# Patient Record
Sex: Female | Born: 1935 | Race: White | Hispanic: No | Smoking: Never smoker
Health system: Southern US, Community
[De-identification: ages and names within clinical notes are randomized; demographics above are authoritative.]

## PROBLEM LIST (undated history)

## (undated) DIAGNOSIS — J189 Pneumonia, unspecified organism: Secondary | ICD-10-CM

## (undated) DIAGNOSIS — F419 Anxiety disorder, unspecified: Secondary | ICD-10-CM

## (undated) DIAGNOSIS — D649 Anemia, unspecified: Secondary | ICD-10-CM

## (undated) DIAGNOSIS — I809 Phlebitis and thrombophlebitis of unspecified site: Secondary | ICD-10-CM

## (undated) DIAGNOSIS — D6851 Activated protein C resistance: Secondary | ICD-10-CM

## (undated) DIAGNOSIS — M5136 Other intervertebral disc degeneration, lumbar region: Secondary | ICD-10-CM

## (undated) DIAGNOSIS — M51369 Other intervertebral disc degeneration, lumbar region without mention of lumbar back pain or lower extremity pain: Secondary | ICD-10-CM

## (undated) DIAGNOSIS — E785 Hyperlipidemia, unspecified: Secondary | ICD-10-CM

## (undated) DIAGNOSIS — M5126 Other intervertebral disc displacement, lumbar region: Secondary | ICD-10-CM

## (undated) DIAGNOSIS — M1990 Unspecified osteoarthritis, unspecified site: Secondary | ICD-10-CM

## (undated) DIAGNOSIS — K4090 Unilateral inguinal hernia, without obstruction or gangrene, not specified as recurrent: Secondary | ICD-10-CM

## (undated) HISTORY — PX: EYE SURGERY: SHX253

## (undated) HISTORY — PX: BREAST CYST ASPIRATION: SHX578

## (undated) HISTORY — PX: HERNIA REPAIR: SHX51

---
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
## 1997-10-14 DIAGNOSIS — J189 Pneumonia, unspecified organism: Secondary | ICD-10-CM

## 1997-10-14 HISTORY — DX: Pneumonia, unspecified organism: J18.9

## 2005-06-24 ENCOUNTER — Ambulatory Visit: Payer: Self-pay | Admitting: Unknown Physician Specialty

## 2006-06-12 ENCOUNTER — Ambulatory Visit: Payer: Self-pay | Admitting: Gastroenterology

## 2006-07-01 ENCOUNTER — Ambulatory Visit: Payer: Self-pay | Admitting: Unknown Physician Specialty

## 2007-08-11 ENCOUNTER — Ambulatory Visit: Payer: Self-pay | Admitting: Unknown Physician Specialty

## 2008-08-23 ENCOUNTER — Ambulatory Visit: Payer: Self-pay | Admitting: Unknown Physician Specialty

## 2009-08-25 ENCOUNTER — Ambulatory Visit: Payer: Self-pay | Admitting: Unknown Physician Specialty

## 2009-08-31 ENCOUNTER — Ambulatory Visit: Payer: Self-pay | Admitting: Unknown Physician Specialty

## 2010-03-01 ENCOUNTER — Ambulatory Visit: Payer: Self-pay | Admitting: Unknown Physician Specialty

## 2010-09-28 ENCOUNTER — Ambulatory Visit: Payer: Self-pay | Admitting: Unknown Physician Specialty

## 2011-06-12 ENCOUNTER — Ambulatory Visit: Payer: Self-pay | Admitting: Family Medicine

## 2011-09-09 ENCOUNTER — Ambulatory Visit: Payer: Self-pay | Admitting: Gastroenterology

## 2011-09-11 LAB — PATHOLOGY REPORT

## 2011-09-17 ENCOUNTER — Emergency Department: Payer: Self-pay | Admitting: *Deleted

## 2011-10-02 ENCOUNTER — Ambulatory Visit: Payer: Self-pay | Admitting: Unknown Physician Specialty

## 2011-10-15 HISTORY — PX: COLONOSCOPY: SHX174

## 2012-10-02 ENCOUNTER — Ambulatory Visit: Payer: Self-pay | Admitting: Family Medicine

## 2012-10-14 HISTORY — PX: CATARACT EXTRACTION: SUR2

## 2013-10-04 ENCOUNTER — Ambulatory Visit: Payer: Self-pay | Admitting: Family Medicine

## 2014-01-19 DIAGNOSIS — E785 Hyperlipidemia, unspecified: Secondary | ICD-10-CM | POA: Insufficient documentation

## 2014-01-19 DIAGNOSIS — Z8672 Personal history of thrombophlebitis: Secondary | ICD-10-CM | POA: Insufficient documentation

## 2014-01-19 DIAGNOSIS — D649 Anemia, unspecified: Secondary | ICD-10-CM | POA: Insufficient documentation

## 2014-01-19 DIAGNOSIS — M810 Age-related osteoporosis without current pathological fracture: Secondary | ICD-10-CM | POA: Insufficient documentation

## 2014-10-05 ENCOUNTER — Ambulatory Visit: Payer: Self-pay | Admitting: Family Medicine

## 2015-12-13 DIAGNOSIS — R05 Cough: Secondary | ICD-10-CM | POA: Diagnosis not present

## 2015-12-13 DIAGNOSIS — J029 Acute pharyngitis, unspecified: Secondary | ICD-10-CM | POA: Diagnosis not present

## 2015-12-13 DIAGNOSIS — R5381 Other malaise: Secondary | ICD-10-CM | POA: Diagnosis not present

## 2015-12-13 DIAGNOSIS — R5383 Other fatigue: Secondary | ICD-10-CM | POA: Diagnosis not present

## 2016-01-10 DIAGNOSIS — Z0000 Encounter for general adult medical examination without abnormal findings: Secondary | ICD-10-CM | POA: Diagnosis not present

## 2016-01-23 DIAGNOSIS — Z0000 Encounter for general adult medical examination without abnormal findings: Secondary | ICD-10-CM | POA: Diagnosis not present

## 2016-01-23 DIAGNOSIS — E785 Hyperlipidemia, unspecified: Secondary | ICD-10-CM | POA: Diagnosis not present

## 2016-02-28 DIAGNOSIS — Z961 Presence of intraocular lens: Secondary | ICD-10-CM | POA: Diagnosis not present

## 2016-04-17 DIAGNOSIS — E785 Hyperlipidemia, unspecified: Secondary | ICD-10-CM | POA: Diagnosis not present

## 2016-04-23 DIAGNOSIS — E785 Hyperlipidemia, unspecified: Secondary | ICD-10-CM | POA: Diagnosis not present

## 2016-08-20 DIAGNOSIS — H903 Sensorineural hearing loss, bilateral: Secondary | ICD-10-CM | POA: Diagnosis not present

## 2016-09-19 DIAGNOSIS — R1909 Other intra-abdominal and pelvic swelling, mass and lump: Secondary | ICD-10-CM | POA: Diagnosis not present

## 2016-09-19 DIAGNOSIS — I10 Essential (primary) hypertension: Secondary | ICD-10-CM | POA: Diagnosis not present

## 2016-09-19 DIAGNOSIS — Z79899 Other long term (current) drug therapy: Secondary | ICD-10-CM | POA: Diagnosis not present

## 2016-09-19 DIAGNOSIS — K4090 Unilateral inguinal hernia, without obstruction or gangrene, not specified as recurrent: Secondary | ICD-10-CM | POA: Diagnosis not present

## 2016-09-19 DIAGNOSIS — R1031 Right lower quadrant pain: Secondary | ICD-10-CM | POA: Diagnosis not present

## 2016-09-19 DIAGNOSIS — E785 Hyperlipidemia, unspecified: Secondary | ICD-10-CM | POA: Diagnosis not present

## 2016-10-02 ENCOUNTER — Encounter: Payer: Self-pay | Admitting: *Deleted

## 2016-10-02 DIAGNOSIS — K4090 Unilateral inguinal hernia, without obstruction or gangrene, not specified as recurrent: Secondary | ICD-10-CM | POA: Diagnosis not present

## 2016-10-17 DIAGNOSIS — E785 Hyperlipidemia, unspecified: Secondary | ICD-10-CM | POA: Diagnosis not present

## 2016-10-21 ENCOUNTER — Ambulatory Visit: Payer: Self-pay | Admitting: General Surgery

## 2016-10-31 ENCOUNTER — Other Ambulatory Visit
Admission: RE | Admit: 2016-10-31 | Discharge: 2016-10-31 | Disposition: A | Discharge status: Home / Self Care | Payer: PPO | Source: Ambulatory Visit | Attending: General Surgery | Admitting: General Surgery

## 2016-10-31 ENCOUNTER — Ambulatory Visit (INDEPENDENT_AMBULATORY_CARE_PROVIDER_SITE_OTHER): Payer: PPO | Admitting: General Surgery

## 2016-10-31 ENCOUNTER — Other Ambulatory Visit: Payer: Self-pay | Admitting: *Deleted

## 2016-10-31 ENCOUNTER — Encounter: Payer: Self-pay | Admitting: *Deleted

## 2016-10-31 ENCOUNTER — Ambulatory Visit: Payer: Self-pay | Admitting: General Surgery

## 2016-10-31 ENCOUNTER — Encounter: Payer: Self-pay | Admitting: General Surgery

## 2016-10-31 VITALS — BP 112/80 | HR 76 | Resp 14 | Ht 62.0 in | Wt 142.0 lb

## 2016-10-31 DIAGNOSIS — Z8249 Family history of ischemic heart disease and other diseases of the circulatory system: Secondary | ICD-10-CM

## 2016-10-31 DIAGNOSIS — D6851 Activated protein C resistance: Secondary | ICD-10-CM

## 2016-10-31 DIAGNOSIS — K4090 Unilateral inguinal hernia, without obstruction or gangrene, not specified as recurrent: Secondary | ICD-10-CM

## 2016-10-31 HISTORY — DX: Activated protein C resistance: D68.51

## 2016-10-31 LAB — CBC WITH DIFFERENTIAL/PLATELET
BASOS ABS: 0.1 10*3/uL (ref 0–0.1)
Basophils Relative: 1 %
Eosinophils Absolute: 0.2 10*3/uL (ref 0–0.7)
Eosinophils Relative: 2 %
HEMATOCRIT: 39.5 % (ref 35.0–47.0)
HEMOGLOBIN: 13.3 g/dL (ref 12.0–16.0)
Lymphocytes Relative: 30 %
Lymphs Abs: 2.5 10*3/uL (ref 1.0–3.6)
MCH: 30.5 pg (ref 26.0–34.0)
MCHC: 33.8 g/dL (ref 32.0–36.0)
MCV: 90.2 fL (ref 80.0–100.0)
Monocytes Absolute: 0.7 10*3/uL (ref 0.2–0.9)
Monocytes Relative: 9 %
NEUTROS PCT: 58 %
Neutro Abs: 4.7 10*3/uL (ref 1.4–6.5)
Platelets: 198 10*3/uL (ref 150–440)
RBC: 4.38 MIL/uL (ref 3.80–5.20)
RDW: 13.6 % (ref 11.5–14.5)
WBC: 8.1 10*3/uL (ref 3.6–11.0)

## 2016-10-31 NOTE — Patient Instructions (Signed)
Inguinal Hernia, Adult Introduction An inguinal hernia is when fat or the intestines push through the area where the leg meets the lower abdomen (groin) and create a rounded lump (bulge). This condition develops over time. There are three types of inguinal hernias. These types include:  Hernias that can be pushed back into the belly (are reducible).  Hernias that are not reducible (are incarcerated).  Hernias that are not reducible and lose their blood supply (are strangulated). This type of hernia requires emergency surgery. What are the causes? This condition is caused by having a weak spot in the muscles or tissue. This weakness lets the hernia poke through. This condition can be triggered by:  Suddenly straining the muscles of the lower abdomen.  Lifting heavy objects.  Straining to have a bowel movement. Difficult bowel movements (constipation) can lead to this.  Coughing. What increases the risk? This condition is more likely to develop in:  Men.  Pregnant women.  People who:  Are overweight.  Work in jobs that require long periods of standing or heavy lifting.  Have had an inguinal hernia before.  Smoke or have lung disease. These factors can lead to long-lasting (chronic) coughing. What are the signs or symptoms? Symptoms can depend on the size of the hernia. Often, a small inguinal hernia has no symptoms. Symptoms of a larger hernia include:  A lump in the groin. This is easier to see when the person is standing. It might not be visible when he or she is lying down.  Pain or burning in the groin. This occurs especially when lifting, straining, or coughing.  A dull ache or a feeling of pressure in the groin.  A lump in the scrotum in men. Symptoms of a strangulated inguinal hernia can include:  A bulge in the groin that is very painful and tender to the touch.  A bulge that turns red or purple.  Fever, nausea, and vomiting.  The inability to have a bowel  movement or to pass gas. How is this diagnosed? This condition is diagnosed with a medical history and physical exam. Your health care provider may feel your groin area and ask you to cough. How is this treated? Treatment for this condition varies depending on the size of your hernia and whether you have symptoms. If you do not have symptoms, your health care provider may have you watch your hernia carefully and come in for follow-up visits. If your hernia is larger or if you have symptoms, your treatment will include surgery. Follow these instructions at home: Lifestyle  Drink enough fluid to keep your urine clear or pale yellow.  Eat a diet that includes a lot of fiber. Eat plenty of fruits, vegetables, and whole grains. Talk with your health care provider if you have questions.  Avoid lifting heavy objects.  Avoid standing for long periods of time.  Do not use tobacco products, including cigarettes, chewing tobacco, or e-cigarettes. If you need help quitting, ask your health care provider.  Maintain a healthy weight. General instructions  Do not try to force the hernia back in.  Watch your hernia for any changes in color or size. Let your health care provider know if any changes occur.  Take over-the-counter and prescription medicines only as told by your health care provider.  Keep all follow-up visits as told by your health care provider. This is important. Contact a health care provider if:  You have a fever.  You have new symptoms.  Your symptoms  get worse. Get help right away if:  You have pain in the groin that suddenly gets worse.  A bulge in the groin gets bigger suddenly and does not go down.  You are a man and you have a sudden pain in the scrotum, or the size of your scrotum suddenly changes.  A bulge in the groin area becomes red or purple and is painful to the touch.  You have nausea or vomiting that does not go away.  You feel your heart beating a lot  more quickly than normal.  You cannot have a bowel movement or pass gas. This information is not intended to replace advice given to you by your health care provider. Make sure you discuss any questions you have with your health care provider. Document Released: 02/16/2009 Document Revised: 03/07/2016 Document Reviewed: 08/10/2014  2017 Elsevier

## 2016-10-31 NOTE — Addendum Note (Signed)
Addended by: Robert Bellow on: 10/31/2016 03:07 PM   Modules accepted: Orders, SmartSet

## 2016-10-31 NOTE — Addendum Note (Signed)
Addended by: Dominga Ferry on: 10/31/2016 02:48 PM   Modules accepted: Level of Service

## 2016-10-31 NOTE — Progress Notes (Addendum)
Patient ID: Kendra Campbell, female   DOB: [birth year], 82 y.o.   MRN: [id number]  Chief Complaint  Patient presents with  . Hernia    HPI Kendra Campbell is a 82 y.o. female here today for a evaluation of a right inguinal hernia. She noticed this area about 10 years ago. No change in size She is fairly vague about how the hernia has bothered her. 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  In December, 2017 she went to the hospital for a sharp pain in her right inguinal area. This resolved with manual reduction of the hernia completed by the ED physician after CT results were available. She's been asymptomatic since that time.  She is due for a colonoscopy in March, 2018To follow-up tubular adenomas identified at the time of her 2012 exam..  HPI  No past medical history on file.  Past Surgical History:  Procedure Laterality Date  . APPENDECTOMY  1957  . CATARACT EXTRACTION  2014  . COLONOSCOPY  2013    Family History  Problem Relation Age of Onset  . Osteoporosis Mother   . Stomach cancer Father   . Factor V Leiden deficiency Neg Hx     PE, long term anticoagulation.    Social History Social History  Substance Use Topics  . Smoking status: Never Smoker  . Smokeless tobacco: Never Used  . Alcohol use No    Allergies  Allergen Reactions  . Alendronate Nausea And Vomiting  . Ampicillin Nausea And Vomiting  . Codeine Sulfate Nausea And Vomiting  . Penicillins Nausea And Vomiting    Current Outpatient Prescriptions  Medication Sig Dispense Refill  . atorvastatin (LIPITOR) 40 MG tablet Take 40 mg by mouth daily at 6 PM.     . sertraline (ZOLOFT) 25 MG tablet Take 25 mg by mouth daily.      No current facility-administered medications for this visit.     Review of Systems Review of Systems  Constitutional: Negative.   Respiratory: Negative.   Cardiovascular: Negative.     Blood pressure 112/80, pulse  76, resp. rate 14, height 5\' 2"  (1.575 m), weight 142 lb (64.4 kg).  Physical Exam Physical Exam  Constitutional: She is oriented to person, place, and time. She appears well-developed and well-nourished.  Eyes: Conjunctivae are normal. No scleral icterus.  Neck: Neck supple.  Cardiovascular: Normal rate, regular rhythm and normal heart sounds.   Pulmonary/Chest: Effort normal and breath sounds normal.  Abdominal: Soft. Normal appearance and bowel sounds are normal. There is no hepatomegaly. There is no tenderness. A hernia is present. Hernia confirmed positive in the right inguinal area.    Lymphadenopathy:    She has no cervical adenopathy.  Neurological: She is alert and oriented to person, place, and time.  Skin: Skin is warm and dry.    Data Reviewed CT scan images from 09/19/2016 were reviewed. A sizable defect in the area of the internal ring is identified with a large amount of small bowel contents. No evidence of obstruction. The cecum and terminal ileum were identified protruding through an inguinal defect. Sigmoid diverticulosis. No evidence of diverticulitis. 7 inflammatory bowel changes. No ascites.  Laboratory studies dated 10/17/2016 completed at her PCP office showed an estimated GFR of 60, creatinine 0.9, normal electrolytes, normal liver function studies in [place] 4.6. Random blood sugar 89. Normal cholesterol 172, triglyceride 93 and an HDL of 54.  CBC dated 01/10/2016  showed a hemoglobin of 13.0 with an MCV of 92, white blood cell count 6500 and a platelet count of based bar 209,000.   Assessment    Right inguinal hernia, episode of likely incarceration, resolved.  Family history Leiden factor V deficiency.   Plan    After repair would be appropriate considering the patient's need to seek emergency attention last month.  Her daughter, Kendra Campbell was conferenced in for the discussion of surgical intervention. The role of prosthetic mesh was discussed.  Anticipate that a home assistant would be appropriate for for 5 days and afterwards the patient would likely be able to drive within a week to 10 days to surgery.  The patient daughter reported that she and her sister have both been detected to have Leiden factor V deficiency. Kendra Campbell was picked up with an early heart attack as well as pulmonary embolus and is now actually with daily injectable anticoagulation. She reports that her father had a history of some hematologic problem, likely thrombocytopenia by her description.  We'll arrange for factor V Leiden level determination and a current CBC prior to surgical intervention.  The patient has never been tested for any clotting abnormalities as she has been incredibly healthy, her only surgical intervention being her appendectomy at the end of high school.    Hernia precautions and incarceration were discussed with the patient. If they develop symptoms of an incarcerated hernia, they were encouraged to seek prompt medical attention.  I have recommended repair of the hernia using mesh on an outpatient basis in the near future. The risk of infection was reviewed. The role of prosthetic mesh to minimize the risk of recurrence was reviewed.  This information has been scribed by Gaspar Cola CMA.   Robert Bellow 10/31/2016, 2:36 PM

## 2016-10-31 NOTE — Progress Notes (Signed)
Patient's surgery has been scheduled for 11-11-16 at [facility].

## 2016-11-04 LAB — FACTOR 5 LEIDEN

## 2016-11-05 ENCOUNTER — Inpatient Hospital Stay: Admission: RE | Admit: 2016-11-05 | Payer: PPO | Source: Ambulatory Visit

## 2016-11-05 MED ORDER — ARMC OPHTHALMIC DILATING DROPS
OPHTHALMIC | Status: AC
  Filled 2016-11-05: qty 0.4

## 2016-11-05 MED ORDER — MOXIFLOXACIN HCL 0.5 % OP SOLN
OPHTHALMIC | Status: AC
  Filled 2016-11-05: qty 3

## 2016-11-06 ENCOUNTER — Encounter: Payer: Self-pay | Admitting: General Surgery

## 2016-11-06 ENCOUNTER — Ambulatory Visit: Payer: Self-pay | Admitting: General Surgery

## 2016-11-06 DIAGNOSIS — Z01810 Encounter for preprocedural cardiovascular examination: Secondary | ICD-10-CM | POA: Diagnosis not present

## 2016-11-06 NOTE — Progress Notes (Signed)
I spoke with Dr. Mike Gip from hematology regarding the patient being heterozygous for factor V. The patient has had no clotting episodes in the past, and has no risk factors such as smoking.  She is at slightly increased risk for clotting with her upcoming surgery, although it is anticipated she'll be one to ambulate early.  No preoperative anticoagulation is required.  Consideration to using Lovenox 40 mg daily for 1 week after surgery is a consideration to lower her risk for postoperative DVT.

## 2016-11-06 NOTE — Patient Instructions (Signed)
  Your procedure is scheduled on: 11-11-16 [facility]) Report to Same Day Surgery 2nd floor medical mall [facility] Entrance-take elevator on left to 2nd floor.  Check in with surgery information desk.) To find out your arrival time please call [phone] between 1PM - 3PM on 11-08-16 (FRIDAY)  Remember: Instructions that are not followed completely may result in serious medical risk, up to and including death, or upon the discretion of your surgeon and anesthesiologist your surgery may need to be rescheduled.    _x___ 1. Do not eat food or drink liquids after midnight. No gum chewing or hard candies.     __x__ 2. No Alcohol for 24 hours before or after surgery.   __x__3. No Smoking for 24 prior to surgery.   ____  4. Bring all medications with you on the day of surgery if instructed.    __x__ 5. Notify your doctor if there is any change in your medical condition     (cold, fever, infections).     Do not wear jewelry, make-up, hairpins, clips or nail polish.  Do not wear lotions, powders, or perfumes. You may wear deodorant.  Do not shave 48 hours prior to surgery. Men may shave face and neck.  Do not bring valuables to the hospital.    [facility] is not responsible for any belongings or valuables.               Contacts, dentures or bridgework may not be worn into surgery.  Leave your suitcase in the car. After surgery it may be brought to your room.  For patients admitted to the hospital, discharge time is determined by your treatment team.   Patients discharged the day of surgery will not be allowed to drive home.  You will need someone to drive you home and stay with you the night of your procedure.    Please read over the following fact sheets that you were given:   [facility] Preparing for Surgery and or MRSA Information   _x___ Take these medicines the morning of surgery with A SIP OF WATER:    1. SERTRALINE (ZOLOFT)  2.  3.  4.  5.  6.  ____Fleets enema or  Magnesium Citrate as directed.   _x___ Use CHG Soap or sage wipes as directed on instruction sheet   ____ Use inhalers on the day of surgery and bring to hospital day of surgery  ____ Stop metformin 2 days prior to surgery    ____ Take 1/2 of usual insulin dose the night before surgery and none on the morning of surgery.   ____ Stop Aspirin, Coumadin, Pllavix ,Eliquis, Effient, or Pradaxa  __ Stop Anti-inflammatories such as Advil, Aleve, Ibuprofen, Motrin, Naproxen,          Naprosyn, Goodies powders or aspirin products. Ok to take Tylenol.   _X___ Stop supplements until after surgery-STOP CO-Q 10 AND VITAMIN C NOW   ____ Bring C-Pap to the hospital.

## 2016-11-07 ENCOUNTER — Encounter
Admission: RE | Admit: 2016-11-07 | Discharge: 2016-11-07 | Disposition: A | Discharge status: Home / Self Care | Payer: PPO | Source: Ambulatory Visit | Attending: General Surgery | Admitting: General Surgery

## 2016-11-07 DIAGNOSIS — Z01810 Encounter for preprocedural cardiovascular examination: Secondary | ICD-10-CM | POA: Insufficient documentation

## 2016-11-07 DIAGNOSIS — R9431 Abnormal electrocardiogram [ECG] [EKG]: Secondary | ICD-10-CM | POA: Diagnosis not present

## 2016-11-07 HISTORY — DX: Anxiety disorder, unspecified: F41.9

## 2016-11-07 HISTORY — DX: Pneumonia, unspecified organism: J18.9

## 2016-11-07 HISTORY — DX: Other intervertebral disc displacement, lumbar region: M51.26

## 2016-11-07 HISTORY — DX: Anemia, unspecified: D64.9

## 2016-11-07 HISTORY — DX: Other intervertebral disc degeneration, lumbar region: M51.36

## 2016-11-07 HISTORY — DX: Unspecified osteoarthritis, unspecified site: M19.90

## 2016-11-07 HISTORY — DX: Other intervertebral disc degeneration, lumbar region without mention of lumbar back pain or lower extremity pain: M51.369

## 2016-11-08 ENCOUNTER — Telehealth: Payer: Self-pay

## 2016-11-08 ENCOUNTER — Encounter: Payer: Self-pay | Admitting: *Deleted

## 2016-11-08 NOTE — Telephone Encounter (Signed)
Patient called to reschedule her surgery for 11/11/16. Her daughter who would be staying with her has the flu. She wants to reschedule this for about 3 weeks out. She is now rescheduled for surgery at [facility] on 12/02/16. She is aware of date and instructions.

## 2016-11-08 NOTE — Telephone Encounter (Signed)
The patient will have a short pre op visit in office with Dr Bary Castilla for this on 11/21/16 at 9:45 am.

## 2016-11-21 ENCOUNTER — Encounter: Payer: Self-pay | Admitting: General Surgery

## 2016-11-21 ENCOUNTER — Ambulatory Visit (INDEPENDENT_AMBULATORY_CARE_PROVIDER_SITE_OTHER): Payer: PPO | Admitting: General Surgery

## 2016-11-21 VITALS — BP 132/70 | HR 82 | Resp 18 | Ht 62.0 in | Wt 144.0 lb

## 2016-11-21 DIAGNOSIS — D6851 Activated protein C resistance: Secondary | ICD-10-CM | POA: Diagnosis not present

## 2016-11-21 DIAGNOSIS — K4090 Unilateral inguinal hernia, without obstruction or gangrene, not specified as recurrent: Secondary | ICD-10-CM

## 2016-11-21 NOTE — Patient Instructions (Addendum)
The patient is aware to call back for any questions or concerns.  How and Where to Give Subcutaneous Enoxaparin Injections Enoxaparin is an injectable medicine. It is used to help prevent blood clots from developing in your veins. Health care providers often use anticoagulants like enoxaparin to prevent clots following surgery. Enoxaparin is also used in combination with other medicines to treat blood clots and heart attacks. If blood clots are left untreated, they can be life threatening. Enoxaparin comes in single-use syringes. You inject enoxaparin through a syringe into your belly (abdomen). You should change the injection site each time you give yourself a shot. Continue the enoxaparin injections as directed by your health care provider. Your health care provider will use blood clotting test results to decide when you can safely stop using enoxaparin injections. If your health care provider prescribes any additional medicines, use the medicines exactly as directed. How do I inject enoxaparin? 1. Wash your hands with soap and water. 2. Clean the selected injection site as directed by your health care provider. 3. Remove the needle cap by pulling it straight off the syringe. 4. When using a prefilled syringe, do not push the air bubble out of the syringe before the injection. The air bubble will help you get all of the medicine out of the syringe. 5. Hold the syringe like a pencil using your writing hand. 6. Use your other hand to pinch and hold an inch of the cleansed skin. 7. Insert the entire needle straight down into the fold of skin. 8. Push the plunger with your thumb until the syringe is empty. 9. Pull the needle straight out of your skin. 10. Enoxaparin injection prefilled syringes and graduated prefilled syringes are available with a system that shields the needle after injection. After you have completed your injection and removed the needle from your skin, firmly push down on the  plunger. The protective sleeve will automatically cover the needle and you will hear a click. The click means the needle is safely covered. 11. Place the syringe in the nearest needle box, also called a sharps container. If you do not have a sharps container, you can use a hard-sided plastic container with a secure lid, such as an empty laundry detergent bottle. What else do I need to know?  Do not use enoxaparin if:  You have allergies to heparin or pork products.  You have been diagnosed with a condition called thrombocytopenia.  Do not use the syringe or needle more than one time.  Use medicines only as directed by your health care provider.  Changes in medicines, supplements, diet, and illness can affect your anticoagulation therapy. Be sure to inform your health care provider of any of these changes.  It is important that you tell all of your health care providers and your dentist that you are taking an anticoagulant, especially if you are injured or plan to have any type of procedure.  While on anticoagulants, you will need to have blood tests done routinely as directed by your health care provider.  While using this medicine, avoid physical activities or sports that could result in a fall or cause injury.  Follow up with your laboratory test and health care provider appointments as directed. It is very important to keep your appointments. Not keeping appointments could result in a chronic or permanent injury, pain, or disability.  Before giving your medicine, you should make sure the injection is a clear and colorless or pale yellow solution. If your medicine  becomes discolored or if there are particles in the syringe, do not use it and notify your health care provider.  Keep your medicine safely stored at room temperature. Contact a health care provider if:  You develop any rashes on your skin.  You have large areas of bruising on your skin.  You have any worsening of the  condition for which you take Enoxaparin.  You develop a fever. Get help right away if:  You develop bleeding problems such as:  Bleeding from the gums or nose that does not stop quickly.  Vomiting blood or coughing up blood.  Blood in your urine.  Blood in your stool, or stool that has a dark, tarry, or coffee grounds appearance.  A cut that does not stop bleeding within 10 minutes. These symptoms may represent a serious problem that is an emergency. Do not wait to see if the symptoms will go away. Get medical help right away. Call your local emergency services (911 in the [place] Do not drive yourself to the hospital.  This information is not intended to replace advice given to you by your health care provider. Make sure you discuss any questions you have with your health care provider. Document Released: 08/01/2004 Document Revised: 06/06/2016 Document Reviewed: 03/17/2014 Elsevier Interactive Patient Education  2017 [employer]   Inguinal Hernia, Adult Introduction An inguinal hernia is when fat or the intestines push through the area where the leg meets the lower belly (groin) and make a rounded lump (bulge). This condition happens over time. There are three types of inguinal hernias. These types include:  Hernias that can be pushed back into the belly (are reducible).  Hernias that cannot be pushed back into the belly (are incarcerated).  Hernias that cannot be pushed back into the belly and lose their blood supply (get strangulated). This type needs emergency surgery. Follow these instructions at home: Lifestyle  Drink enough fluid to keep your urine (pee) clear or pale yellow.  Eat plenty of fruits, vegetables, and whole grains. These have a lot of fiber. Talk with your doctor if you have questions.  Avoid lifting heavy objects.  Avoid standing for long periods of time.  Do not use tobacco products. These include cigarettes, chewing tobacco, or e-cigarettes. If you  need help quitting, ask your doctor.  Try to stay at a healthy weight. General instructions  Do not try to force the hernia back in.  Watch your hernia for any changes in color or size. Let your doctor know if there are any changes.  Take over-the-counter and prescription medicines only as told by your doctor.  Keep all follow-up visits as told by your doctor. This is important. Contact a doctor if:  You have a fever.  You have new symptoms.  Your symptoms get worse. Get help right away if:  The area where the legs meets the lower belly has:  Pain that gets worse suddenly.  A bulge that gets bigger suddenly and does not go down.  A bulge that turns red or purple.  A bulge that is painful to the touch.  You are a man and your scrotum:  Suddenly feels painful.  Suddenly changes in size.  You feel sick to your stomach (nauseous) and this feeling does not go away.  You throw up (vomit) and this keeps happening.  You feel your heart beating a lot more quickly than normal.  You cannot poop (have a bowel movement) or pass gas. This information is not intended  to replace advice given to you by your health care provider. Make sure you discuss any questions you have with your health care provider. Document Released: 10/31/2006 Document Revised: 03/07/2016 Document Reviewed: 08/10/2014  2017 Elsevier

## 2016-11-21 NOTE — Progress Notes (Signed)
Patient ID: LONYA SCHRENK, female   DOB: 11/26/1934, 82 y.o.   MRN: [id number]  Chief Complaint  Patient presents with  . Pre-op Exam    HPI PATRICE AVETISYAN is a 82 y.o. female.  Here today for her preop visit, right inguinal hernia repair on 12-02-16.  Her daughter is planning on being with her postoperatively, Peter Liverman.  HPI  Past Medical History:  Diagnosis Date  . Anemia    h/o  . Anxiety   . Arthritis    knees and back  . Bulging lumbar disc    PT DOES WATER AEROBICS AND SEES CHIROPRACTOR MONTHLY AND HAS NO PROBLEMS WITH BACK  . Factor V Leiden mutation ([place]) 10/31/2016   herterozygote  . Pneumonia 1999    Past Surgical History:  Procedure Laterality Date  . APPENDECTOMY  1957  . CATARACT EXTRACTION  2014  . COLONOSCOPY  2013  . EYE SURGERY Bilateral     Family History  Problem Relation Age of Onset  . Osteoporosis Mother   . Stomach cancer Father   . Factor V Leiden deficiency Neg Hx     PE, long term anticoagulation.    Social History Social History  Substance Use Topics  . Smoking status: Never Smoker  . Smokeless tobacco: Never Used  . Alcohol use No    Allergies  Allergen Reactions  . Alendronate Nausea And Vomiting  . Ampicillin Nausea And Vomiting    Has patient had a PCN reaction causing immediate rash, facial/tongue/throat swelling, SOB or lightheadedness with hypotension:No Has patient had a PCN reaction causing severe rash involving mucus membranes or skin necrosis:No Has patient had a PCN reaction that required hospitalization:No Has patient had a PCN reaction occurring within the last 10 years:No If all of the above answers are "NO", then may proceed with Cephalosporin use.   . Codeine Sulfate Nausea And Vomiting  . Penicillins Nausea And Vomiting    Has patient had a PCN reaction causing immediate rash, facial/tongue/throat swelling, SOB or lightheadedness with hypotension:Yes Has patient had a PCN reaction causing  severe rash involving mucus membranes or skin necrosis:No Has patient had a PCN reaction that required hospitalization:No Has patient had a PCN reaction occurring within the last 10 years:No If all of the above answers are "NO", then may proceed with Cephalosporin use.    Current Outpatient Prescriptions  Medication Sig Dispense Refill  . atorvastatin (LIPITOR) 40 MG tablet Take 40 mg by mouth daily at 6 PM.     . carboxymethylcellulose (REFRESH PLUS) 0.5 % SOLN Place 1-2 drops into both eyes 2 (two) times daily.    . cholecalciferol (VITAMIN D) 1000 units tablet Take 1,000 Units by mouth daily.    . Coenzyme Q10 (CO Q-10) 200 MG CAPS Take 200 mg by mouth daily.    . Cyanocobalamin (VITAMIN B-12 PO) Place 1 tablet under the tongue daily.     . diphenhydrAMINE (BENADRYL) 25 MG tablet Take 25 mg by mouth daily.    Marland Kitchen ibuprofen (ADVIL,MOTRIN) 200 MG tablet Take 200 mg by mouth every 8 (eight) hours as needed (for pain.).    Marland Kitchen sertraline (ZOLOFT) 25 MG tablet Take 25 mg by mouth every morning.     . vitamin C (ASCORBIC ACID) 500 MG tablet Take 500 mg by mouth as needed.     No current facility-administered medications for this visit.     Review of Systems Review of Systems  Constitutional: Negative.   Respiratory: Negative.   Cardiovascular:  Negative.     Blood pressure 132/70, pulse 82, resp. rate 18, height 5\' 2"  (1.575 m), weight 144 lb (65.3 kg).  Physical Exam Physical Exam  Constitutional: She is oriented to person, place, and time. She appears well-developed and well-nourished.  HENT:  Mouth/Throat: Oropharynx is clear and moist.  Eyes: Conjunctivae are normal. No scleral icterus.  Neck: Neck supple.  Cardiovascular: Normal rate, regular rhythm and normal heart sounds.   Pulmonary/Chest: Effort normal and breath sounds normal.  Abdominal: Soft. Normal appearance. A hernia is present. Hernia confirmed positive in the right inguinal area.  Lymphadenopathy:    She has no  cervical adenopathy.  Neurological: She is alert and oriented to person, place, and time.  Skin: Skin is warm and dry.  Psychiatric: Her behavior is normal.    Data Reviewed Testing showed the patient is heterozygous for Leiden factor V deficiency.  I spoke with Dr. Mike Gip from hematology regarding the patient being heterozygous for factor V. The patient has had no clotting episodes in the past, and has no risk factors such as smoking.  She is at slightly increased risk for clotting with her upcoming surgery, although it is anticipated she'll be one to ambulate early.  No preoperative anticoagulation is required.  Consideration to using Lovenox 40 mg daily for 1 week after surgery is a consideration to   Assessment    Right inguinal hernia.    Plan          Hernia precautions and incarceration were discussed with the patient. If they develop symptoms of an incarcerated hernia, they were encouraged to seek prompt medical attention.  I have recommended repair of the hernia using mesh on an outpatient basis in the near future. The risk of infection was reviewed. The role of prosthetic mesh to minimize the risk of recurrence was reviewed.  We will proceed as scheduled with open right inguinal hernia repair on 12-02-16 at [facility].   The patient's daughter will be available to assist with injections for the short period of time required.  This information has been scribed by Karie Fetch RN, BSN,BC.   Robert Bellow 11/22/2016, 2:51 PM

## 2016-11-22 DIAGNOSIS — Z0000 Encounter for general adult medical examination without abnormal findings: Secondary | ICD-10-CM | POA: Diagnosis not present

## 2016-11-22 DIAGNOSIS — D6851 Activated protein C resistance: Secondary | ICD-10-CM | POA: Insufficient documentation

## 2016-12-02 ENCOUNTER — Ambulatory Visit
Admission: RE | Admit: 2016-12-02 | Discharge: 2016-12-02 | Disposition: A | Discharge status: Home / Self Care | Payer: PPO | Source: Ambulatory Visit | Attending: General Surgery | Admitting: General Surgery

## 2016-12-02 ENCOUNTER — Ambulatory Visit: Payer: PPO | Admitting: Anesthesiology

## 2016-12-02 ENCOUNTER — Encounter: Payer: Self-pay | Admitting: *Deleted

## 2016-12-02 ENCOUNTER — Encounter
Admission: RE | Disposition: A | Discharge status: Home / Self Care | Payer: Self-pay | Source: Ambulatory Visit | Attending: General Surgery

## 2016-12-02 DIAGNOSIS — K4090 Unilateral inguinal hernia, without obstruction or gangrene, not specified as recurrent: Secondary | ICD-10-CM | POA: Diagnosis not present

## 2016-12-02 DIAGNOSIS — D179 Benign lipomatous neoplasm, unspecified: Secondary | ICD-10-CM | POA: Diagnosis not present

## 2016-12-02 DIAGNOSIS — Z885 Allergy status to narcotic agent status: Secondary | ICD-10-CM | POA: Insufficient documentation

## 2016-12-02 DIAGNOSIS — Z800 Family history of malignant neoplasm of digestive organs: Secondary | ICD-10-CM | POA: Insufficient documentation

## 2016-12-02 DIAGNOSIS — D649 Anemia, unspecified: Secondary | ICD-10-CM | POA: Diagnosis not present

## 2016-12-02 DIAGNOSIS — Z888 Allergy status to other drugs, medicaments and biological substances status: Secondary | ICD-10-CM | POA: Insufficient documentation

## 2016-12-02 DIAGNOSIS — Z791 Long term (current) use of non-steroidal anti-inflammatories (NSAID): Secondary | ICD-10-CM | POA: Diagnosis not present

## 2016-12-02 DIAGNOSIS — Z880 Allergy status to penicillin: Secondary | ICD-10-CM | POA: Insufficient documentation

## 2016-12-02 DIAGNOSIS — Z9889 Other specified postprocedural states: Secondary | ICD-10-CM | POA: Insufficient documentation

## 2016-12-02 DIAGNOSIS — M479 Spondylosis, unspecified: Secondary | ICD-10-CM | POA: Insufficient documentation

## 2016-12-02 DIAGNOSIS — M170 Bilateral primary osteoarthritis of knee: Secondary | ICD-10-CM | POA: Diagnosis not present

## 2016-12-02 DIAGNOSIS — Z8262 Family history of osteoporosis: Secondary | ICD-10-CM | POA: Diagnosis not present

## 2016-12-02 DIAGNOSIS — D6851 Activated protein C resistance: Secondary | ICD-10-CM | POA: Insufficient documentation

## 2016-12-02 DIAGNOSIS — F419 Anxiety disorder, unspecified: Secondary | ICD-10-CM | POA: Diagnosis not present

## 2016-12-02 DIAGNOSIS — Z9049 Acquired absence of other specified parts of digestive tract: Secondary | ICD-10-CM | POA: Insufficient documentation

## 2016-12-02 DIAGNOSIS — Z79899 Other long term (current) drug therapy: Secondary | ICD-10-CM | POA: Insufficient documentation

## 2016-12-02 DIAGNOSIS — M5186 Other intervertebral disc disorders, lumbar region: Secondary | ICD-10-CM | POA: Diagnosis not present

## 2016-12-02 DIAGNOSIS — Z881 Allergy status to other antibiotic agents status: Secondary | ICD-10-CM | POA: Diagnosis not present

## 2016-12-02 HISTORY — PX: INGUINAL HERNIA REPAIR: SHX194

## 2016-12-02 HISTORY — DX: Activated protein C resistance: D68.51

## 2016-12-02 SURGERY — REPAIR, HERNIA, INGUINAL, ADULT
Anesthesia: General | Laterality: Right | Wound class: Clean

## 2016-12-02 MED ORDER — HYDROCODONE-ACETAMINOPHEN 5-325 MG PO TABS: 1.0000 | ORAL_TABLET | ORAL | 0 refills | Status: DC | PRN

## 2016-12-02 MED ORDER — LACTATED RINGERS IV SOLN
INTRAVENOUS | Status: DC
  Administered 2016-12-02 (×2): via INTRAVENOUS

## 2016-12-02 MED ORDER — ENOXAPARIN SODIUM 40 MG/0.4ML ~~LOC~~ SOLN: 40.0000 mg | SUBCUTANEOUS | 0 refills | Status: DC

## 2016-12-02 MED ORDER — ACETAMINOPHEN 10 MG/ML IV SOLN
INTRAVENOUS | Status: DC | PRN
  Administered 2016-12-02: 1000 mg via INTRAVENOUS

## 2016-12-02 MED ORDER — HYDROMORPHONE HCL 1 MG/ML IJ SOLN
INTRAMUSCULAR | Status: AC
  Filled 2016-12-02: qty 1

## 2016-12-02 MED ORDER — FENTANYL CITRATE (PF) 100 MCG/2ML IJ SOLN
INTRAMUSCULAR | Status: AC
  Filled 2016-12-02: qty 2

## 2016-12-02 MED ORDER — ONDANSETRON HCL 4 MG/2ML IJ SOLN
INTRAMUSCULAR | Status: AC
  Filled 2016-12-02: qty 2

## 2016-12-02 MED ORDER — ACETAMINOPHEN 10 MG/ML IV SOLN
INTRAVENOUS | Status: AC
  Filled 2016-12-02: qty 100

## 2016-12-02 MED ORDER — ONDANSETRON 4 MG PO TBDP: 4.0000 mg | ORAL_TABLET | ORAL | 0 refills | Status: DC | PRN

## 2016-12-02 MED ORDER — BUPIVACAINE-EPINEPHRINE (PF) 0.5% -1:200000 IJ SOLN
INTRAMUSCULAR | Status: AC
  Filled 2016-12-02: qty 30

## 2016-12-02 MED ORDER — CEFAZOLIN SODIUM-DEXTROSE 2-4 GM/100ML-% IV SOLN
2.0000 g | INTRAVENOUS | Status: AC
  Administered 2016-12-02: 2 g via INTRAVENOUS

## 2016-12-02 MED ORDER — BUPIVACAINE-EPINEPHRINE (PF) 0.5% -1:200000 IJ SOLN
INTRAMUSCULAR | Status: DC | PRN
  Administered 2016-12-02: 30 mL

## 2016-12-02 MED ORDER — FAMOTIDINE 20 MG PO TABS
ORAL_TABLET | ORAL | Status: AC
  Administered 2016-12-02: 20 mg via ORAL
  Filled 2016-12-02: qty 1

## 2016-12-02 MED ORDER — OXYCODONE HCL 5 MG PO TABS: 5.0000 mg | ORAL_TABLET | Freq: Once | ORAL | Status: DC | PRN

## 2016-12-02 MED ORDER — MIDAZOLAM HCL 2 MG/2ML IJ SOLN
INTRAMUSCULAR | Status: AC
  Filled 2016-12-02: qty 2

## 2016-12-02 MED ORDER — PHENYLEPHRINE HCL 10 MG/ML IJ SOLN
INTRAMUSCULAR | Status: DC | PRN
  Administered 2016-12-02 (×4): 100 ug via INTRAVENOUS

## 2016-12-02 MED ORDER — EPHEDRINE SULFATE 50 MG/ML IJ SOLN
INTRAMUSCULAR | Status: AC
  Filled 2016-12-02: qty 1

## 2016-12-02 MED ORDER — FAMOTIDINE 20 MG PO TABS
20.0000 mg | ORAL_TABLET | Freq: Once | ORAL | Status: AC
  Administered 2016-12-02: 20 mg via ORAL

## 2016-12-02 MED ORDER — EPHEDRINE SULFATE 50 MG/ML IJ SOLN
INTRAMUSCULAR | Status: DC | PRN
  Administered 2016-12-02: 10 mg via INTRAVENOUS

## 2016-12-02 MED ORDER — FENTANYL CITRATE (PF) 100 MCG/2ML IJ SOLN: 25.0000 ug | INTRAMUSCULAR | Status: DC | PRN

## 2016-12-02 MED ORDER — CEFAZOLIN SODIUM-DEXTROSE 2-4 GM/100ML-% IV SOLN
INTRAVENOUS | Status: AC
  Administered 2016-12-02: 2 g via INTRAVENOUS
  Filled 2016-12-02: qty 100

## 2016-12-02 MED ORDER — LIDOCAINE HCL (CARDIAC) 20 MG/ML IV SOLN
INTRAVENOUS | Status: DC | PRN
  Administered 2016-12-02: 100 mg via INTRAVENOUS

## 2016-12-02 MED ORDER — ENOXAPARIN SODIUM 40 MG/0.4ML ~~LOC~~ SOLN
40.0000 mg | Freq: Once | SUBCUTANEOUS | Status: AC
  Administered 2016-12-02: 40 mg via SUBCUTANEOUS
  Filled 2016-12-02: qty 0.4

## 2016-12-02 MED ORDER — PROPOFOL 10 MG/ML IV BOLUS
INTRAVENOUS | Status: AC
  Filled 2016-12-02: qty 20

## 2016-12-02 MED ORDER — HYDROMORPHONE HCL 1 MG/ML IJ SOLN
INTRAMUSCULAR | Status: DC | PRN
  Administered 2016-12-02: .4 mg via INTRAVENOUS

## 2016-12-02 MED ORDER — OXYCODONE HCL 5 MG/5ML PO SOLN: 5.0000 mg | Freq: Once | ORAL | Status: DC | PRN

## 2016-12-02 SURGICAL SUPPLY — 33 items
BLADE SURG 15 STRL SS SAFETY (BLADE) ×4 IMPLANT
CANISTER SUCT 1200ML W/VALVE (MISCELLANEOUS) ×2 IMPLANT
CHLORAPREP W/TINT 26ML (MISCELLANEOUS) ×2 IMPLANT
DECANTER SPIKE VIAL GLASS SM (MISCELLANEOUS) ×2 IMPLANT
DRAIN PENROSE 1/4X12 LTX (DRAIN) ×2 IMPLANT
DRAPE LAPAROTOMY 100X77 ABD (DRAPES) ×2 IMPLANT
DRESSING TELFA 4X3 1S ST N-ADH (GAUZE/BANDAGES/DRESSINGS) ×2 IMPLANT
DRSG TEGADERM 4X4.75 (GAUZE/BANDAGES/DRESSINGS) ×2 IMPLANT
ELECT REM PT RETURN 9FT ADLT (ELECTROSURGICAL) ×2
ELECTRODE REM PT RTRN 9FT ADLT (ELECTROSURGICAL) ×1 IMPLANT
GLOVE BIO SURGEON STRL SZ7.5 (GLOVE) ×2 IMPLANT
GLOVE INDICATOR 8.0 STRL GRN (GLOVE) ×2 IMPLANT
GOWN STRL REUS W/ TWL LRG LVL3 (GOWN DISPOSABLE) ×2 IMPLANT
GOWN STRL REUS W/TWL LRG LVL3 (GOWN DISPOSABLE) ×2
KIT RM TURNOVER STRD PROC AR (KITS) ×2 IMPLANT
LABEL OR SOLS (LABEL) ×2 IMPLANT
MESH HERNIA 6X12 ULTRAPRO MED (Mesh General) ×1 IMPLANT
MESH HERNIA ULTRAPRO MED (Mesh General) ×1 IMPLANT
NDL SAFETY 22GX1.5 (NEEDLE) ×4 IMPLANT
NEEDLE HYPO 25X1 1.5 SAFETY (NEEDLE) ×2 IMPLANT
PACK BASIN MINOR ARMC (MISCELLANEOUS) ×2 IMPLANT
STRIP CLOSURE SKIN 1/2X4 (GAUZE/BANDAGES/DRESSINGS) ×2 IMPLANT
SUT SURGILON 0 BLK (SUTURE) ×4 IMPLANT
SUT VIC AB 2-0 SH 27 (SUTURE) ×1
SUT VIC AB 2-0 SH 27XBRD (SUTURE) ×1 IMPLANT
SUT VIC AB 3-0 54X BRD REEL (SUTURE) ×1 IMPLANT
SUT VIC AB 3-0 BRD 54 (SUTURE) ×1
SUT VIC AB 3-0 SH 27 (SUTURE) ×1
SUT VIC AB 3-0 SH 27X BRD (SUTURE) ×1 IMPLANT
SUT VIC AB 4-0 FS2 27 (SUTURE) ×2 IMPLANT
SWABSTK COMLB BENZOIN TINCTURE (MISCELLANEOUS) ×2 IMPLANT
SYR 3ML LL SCALE MARK (SYRINGE) ×2 IMPLANT
SYR CONTROL 10ML (SYRINGE) ×2 IMPLANT

## 2016-12-02 NOTE — Discharge Instructions (Signed)

## 2016-12-02 NOTE — H&P (Signed)
No change in clinical history or exam.   Plan: Right inguinal hernia repair.

## 2016-12-02 NOTE — Anesthesia Post-op Follow-up Note (Cosign Needed)
Anesthesia QCDR form completed.        

## 2016-12-02 NOTE — Anesthesia Procedure Notes (Signed)
Procedure Name: LMA Insertion Date/Time: 12/02/2016 7:38 AM Performed by: Justus Memory Pre-anesthesia Checklist: Patient identified, Emergency Drugs available, Suction available and Patient being monitored Patient Re-evaluated:Patient Re-evaluated prior to inductionOxygen Delivery Method: Circle system utilized Preoxygenation: Pre-oxygenation with 100% oxygen Intubation Type: IV induction Ventilation: Mask ventilation without difficulty LMA: LMA inserted LMA Size: 3.5

## 2016-12-02 NOTE — Anesthesia Postprocedure Evaluation (Signed)
Anesthesia Post Note  Patient: Kendra Campbell  Procedure(s) Performed: Procedure(s) (LRB): HERNIA REPAIR INGUINAL ADULT (Right)  Patient location during evaluation: PACU Anesthesia Type: General Level of consciousness: awake and alert Pain management: pain level controlled Vital Signs Assessment: post-procedure vital signs reviewed and stable Respiratory status: spontaneous breathing, nonlabored ventilation, respiratory function stable and patient connected to nasal cannula oxygen Cardiovascular status: blood pressure returned to baseline and stable Postop Assessment: no signs of nausea or vomiting Anesthetic complications: no     Last Vitals:  Vitals:   12/02/16 0911 12/02/16 0946  BP: (!) 164/70 (!) 152/82  Pulse: 69 68  Resp: 14 14  Temp: 36.4 C     Last Pain:  Vitals:   12/02/16 0911  TempSrc: Temporal  PainSc:                  Precious Haws Chalsey Leeth

## 2016-12-02 NOTE — Op Note (Signed)
Preoperative diagnosis: Large right indirect inguinal hernia.  Postoperative diagnosis: Same, with adjacent lipoma.  Operative procedure: Repair of right indirect inguinal hernia with medium Ultra Pro mesh, removal of lipoma.  Operating surgeon: Ollen Bowl, M.D.  Anesthesia: Gen. by LMA, Marcaine 0.5% with 1-200,000 epinephrine, 30 mL.  Estimated blood loss: Less than 5 mL.  Clinical note: This active 82 year old woman has developed a intermittent bulge in the right inguinal area. This prompted one trip to the ED where she underwent a CT scan showing a large indirect inguinal hernia. She is admitted for elective repair. She has recently been diagnosed with heterozygous factor V Leiden deficiency and recommendations were for anticoagulation with Lovenox for 1 week after surgery. The patient received Kefzol prior to procedure. Hair was removed from the surgical site in the preoperative area.  Operative note: With the patient under adequate general anesthesia the area was cleansed with ChloraPrep and draped. A 5 cm Incision was made along the anticipated course of the inguinal canal. This was completed after the last attic was infiltrated for postoperative analgesia as a field block. The skin was incised sharply and the remaining dissection completed with electrocautery. The external plica was opened in the direction of its fibers. The round ligament was divided with 3-0 Vicryl ties and a large indirect hernia sac proximally the size of the naval orange was dissected free and the preperitoneal space cleared and the sac returned. A lipoma was removed with hemostasis achieved with 3-0 Vicryl ties. A medium ultra Pro mesh was smoothed in the preperitoneal position and the external component along the inguinal canal. This was anchored to the pubic tubercle with 0 Surgilon along the inguinal ligament with interrupted 0 Surgilon sutures. The medial and superior borders were anchored to the transverse  abdominis aponeurosis in a similar fashion. The ilio inguinal and genital femoral nerves were identified and protected. The iliohypogastric nerve was not visualized. The external oblique was closed with a running 2-0 Vicryl suture. Scarpa's fascia was closed with a running 3-0 Vicryl suture and the skin closed with a running 4-0 Vicryl subcuticular suture. Benzoin, Steri-Strips, Telfa and Tegaderm dressings were applied.  The patient tolerated the procedure well and was taken to recovery in stable condition

## 2016-12-02 NOTE — OR Nursing (Signed)
Dr. Bary Castilla aware that patient had a rash with PCN. He would like to proceed with the ancef pre-op.

## 2016-12-02 NOTE — Transfer of Care (Signed)
Immediate Anesthesia Transfer of Care Note  Patient: Kendra Campbell  Procedure(s) Performed: Procedure(s): HERNIA REPAIR INGUINAL ADULT (Right)  Patient Location: PACU  Anesthesia Type:General  Level of Consciousness: sedated  Airway & Oxygen Therapy: Patient Spontanous Breathing and Patient connected to face mask oxygen  Post-op Assessment:  ost vital signs: VSS  Last Vitals:  Vitals:   12/02/16 0616 12/02/16 0830  BP: 139/76   Pulse: 92   Resp: 18   Temp: 36.1 C (!) (P) 35.8 C    Last Pain:  Vitals:   12/02/16 0616  TempSrc: Tympanic  PainSc: 0-No pain         Complications: No apparent anesthesia complications

## 2016-12-02 NOTE — Anesthesia Preprocedure Evaluation (Signed)
Anesthesia Evaluation  Patient identified by MRN, date of birth, ID band Patient awake    Reviewed: Allergy & Precautions, H&P , NPO status , Patient's Chart, lab work & pertinent test results  Airway Mallampati: III  TM Distance: >3 FB Neck ROM: limited    Dental  (+) Poor Dentition, Chipped, Caps   Pulmonary neg shortness of breath, pneumonia, resolved,    Pulmonary exam normal breath sounds clear to auscultation       Cardiovascular Exercise Tolerance: Good (-) angina(-) Past MI and (-) DOE negative cardio ROS Normal cardiovascular exam Rhythm:regular Rate:Normal     Neuro/Psych negative neurological ROS  negative psych ROS   GI/Hepatic negative GI ROS, Neg liver ROS, neg GERD  ,  Endo/Other  negative endocrine ROS  Renal/GU      Musculoskeletal  (+) Arthritis ,   Abdominal   Peds  Hematology negative hematology ROS (+)   Anesthesia Other Findings Past Medical History: No date: Anemia     Comment: h/o No date: Anxiety No date: Arthritis     Comment: knees and back No date: Bulging lumbar disc     Comment: PT DOES WATER AEROBICS AND SEES CHIROPRACTOR               MONTHLY AND HAS NO PROBLEMS WITH BACK 10/31/2016: Factor V Leiden mutation [facility])     Comment: herterozygote 1999: Pneumonia  Past Surgical History: 1957: APPENDECTOMY 2014: CATARACT EXTRACTION 2013: COLONOSCOPY No date: EYE SURGERY Bilateral     Reproductive/Obstetrics negative OB ROS                             Anesthesia Physical Anesthesia Plan  ASA: III  Anesthesia Plan: General LMA   Post-op Pain Management:    Induction:   Airway Management Planned:   Additional Equipment:   Intra-op Plan:   Post-operative Plan:   Informed Consent: I have reviewed the patients History and Physical, chart, labs and discussed the procedure including the risks, benefits and alternatives for the proposed  anesthesia with the patient or authorized representative who has indicated his/her understanding and acceptance.   Dental Advisory Given  Plan Discussed with: Anesthesiologist, CRNA and Surgeon  Anesthesia Plan Comments:         Anesthesia Quick Evaluation

## 2016-12-03 ENCOUNTER — Encounter: Payer: Self-pay | Admitting: General Surgery

## 2016-12-03 ENCOUNTER — Telehealth: Payer: Self-pay | Admitting: *Deleted

## 2016-12-03 NOTE — Telephone Encounter (Signed)
Received fax from [facility] regarding authorization for Zofran. I called the patient this morning and she is doing well. She is not having any nausea from the pain medications at this time (aware of authorization issue). She will let us know if that changes, appreciates call. [facility] aware and will put RX on hold.

## 2016-12-04 ENCOUNTER — Telehealth: Payer: Self-pay | Admitting: *Deleted

## 2016-12-04 NOTE — Telephone Encounter (Signed)
Zofran approved, pt picked RX up and is using to control. Pt feeling better. The patient is aware to call back for any questions or concerns.

## 2016-12-11 ENCOUNTER — Ambulatory Visit (INDEPENDENT_AMBULATORY_CARE_PROVIDER_SITE_OTHER): Payer: PPO | Admitting: General Surgery

## 2016-12-11 ENCOUNTER — Encounter: Payer: Self-pay | Admitting: General Surgery

## 2016-12-11 VITALS — BP 122/70 | HR 72 | Resp 14 | Ht 62.0 in | Wt 143.0 lb

## 2016-12-11 DIAGNOSIS — K4090 Unilateral inguinal hernia, without obstruction or gangrene, not specified as recurrent: Secondary | ICD-10-CM

## 2016-12-11 NOTE — Patient Instructions (Addendum)
Return as needed.Proper lifting techniques reviewed. 

## 2016-12-11 NOTE — Progress Notes (Signed)
Patient ID: Kendra Campbell, female   DOB: 03-01-1935, 82 y.o.   MRN: [id number]  Chief Complaint  Patient presents with  . Routine Post Op    right inguinal hernia    HPI Kendra Campbell is a 82 y.o. female here today for her post op right inguinal hernia repair done on 12/02/2016. Patient states she is doing well.  HPI  Past Medical History:  Diagnosis Date  . Anemia    h/o  . Anxiety   . Arthritis    knees and back  . Bulging lumbar disc    PT DOES WATER AEROBICS AND SEES CHIROPRACTOR MONTHLY AND HAS NO PROBLEMS WITH BACK  . Factor V Leiden mutation ([place]) 10/31/2016   herterozygote  . Pneumonia 1999    Past Surgical History:  Procedure Laterality Date  . APPENDECTOMY  1957  . CATARACT EXTRACTION  2014  . COLONOSCOPY  2013  . EYE SURGERY Bilateral   . INGUINAL HERNIA REPAIR Right 12/02/2016   Medium Ultra Pro mesh.  Surgeon: Robert Bellow, MD;  Location: [facility] ORS;  Service: General;  Laterality: Right;    Family History  Problem Relation Age of Onset  . Osteoporosis Mother   . Stomach cancer Father   . Factor V Leiden deficiency Neg Hx     PE, long term anticoagulation.    Social History Social History  Substance Use Topics  . Smoking status: Never Smoker  . Smokeless tobacco: Never Used  . Alcohol use No    Allergies  Allergen Reactions  . Alendronate Nausea And Vomiting  . Ampicillin Nausea And Vomiting    Has patient had a PCN reaction causing immediate rash, facial/tongue/throat swelling, SOB or lightheadedness with hypotension:No Has patient had a PCN reaction causing severe rash involving mucus membranes or skin necrosis:No Has patient had a PCN reaction that required hospitalization:No Has patient had a PCN reaction occurring within the last 10 years:No If all of the above answers are "NO", then may proceed with Cephalosporin use.   . Codeine Sulfate Nausea And Vomiting  . Penicillins Nausea And Vomiting    Has patient had a PCN reaction  causing immediate rash, facial/tongue/throat swelling, SOB or lightheadedness with hypotension:Yes Has patient had a PCN reaction causing severe rash involving mucus membranes or skin necrosis:No Has patient had a PCN reaction that required hospitalization:No Has patient had a PCN reaction occurring within the last 10 years:No If all of the above answers are "NO", then may proceed with Cephalosporin use.    Current Outpatient Prescriptions  Medication Sig Dispense Refill  . atorvastatin (LIPITOR) 40 MG tablet Take 40 mg by mouth daily at 6 PM.     . carboxymethylcellulose (REFRESH PLUS) 0.5 % SOLN Place 1-2 drops into both eyes 2 (two) times daily.    . cholecalciferol (VITAMIN D) 1000 units tablet Take 1,000 Units by mouth daily.    . Coenzyme Q10 (CO Q-10) 200 MG CAPS Take 200 mg by mouth daily.    . Cyanocobalamin (VITAMIN B-12 PO) Place 1 tablet under the tongue daily.     . diphenhydrAMINE (BENADRYL) 25 MG tablet Take 25 mg by mouth daily.    Marland Kitchen enoxaparin (LOVENOX) 40 MG/0.4ML injection Inject 0.4 mLs (40 mg total) into the skin daily. 6 Syringe 0  . ibuprofen (ADVIL,MOTRIN) 200 MG tablet Take 200 mg by mouth every 8 (eight) hours as needed (for pain.).    Marland Kitchen sertraline (ZOLOFT) 25 MG tablet Take 25 mg by mouth every morning.     Marland Kitchen  vitamin C (ASCORBIC ACID) 500 MG tablet Take 500 mg by mouth as needed.     No current facility-administered medications for this visit.     Review of Systems Review of Systems  Constitutional: Negative.   Respiratory: Negative.   Cardiovascular: Negative.     Blood pressure 122/70, pulse 72, resp. rate 14, height 5\' 2"  (1.575 m), weight 143 lb (64.9 kg).  Physical Exam Physical Exam  Constitutional: She is oriented to person, place, and time. She appears well-developed and well-nourished.  Abdominal: Soft. Bowel sounds are normal. There is no tenderness. No hernia.    Right inguinal hernia repair intact and healing well.   Neurological: She is  alert and oriented to person, place, and time.  Skin: Skin is warm and dry.    Data Reviewed Large indirect inguinal hernia repaired with a medium Ultra Pro mesh. Preoperative testing showed Leiden factor V at her side is state.  Assessment    Doing well status post open repair of right indirect inguinal hernia.  No difficulty with Lovenox shots postoperatively.    Plan    A she will resume her activity as tolerated. She may resume water aerobics.  Should she require surgery in the future she has been encouraged to notify her physician of the Leiden factor V deficiency so she can receive anticoagulation postprocedure.    Patient to return as needed. Proper lifting techniques reviewed. This information has been scribed by Gaspar Cola CMA.   Robert Bellow 12/11/2016, 10:50 AM

## 2016-12-13 DIAGNOSIS — D6851 Activated protein C resistance: Secondary | ICD-10-CM | POA: Diagnosis not present

## 2016-12-13 DIAGNOSIS — Z86010 Personal history of colonic polyps: Secondary | ICD-10-CM | POA: Diagnosis not present

## 2017-01-29 DIAGNOSIS — E785 Hyperlipidemia, unspecified: Secondary | ICD-10-CM | POA: Diagnosis not present

## 2017-01-29 DIAGNOSIS — Z0000 Encounter for general adult medical examination without abnormal findings: Secondary | ICD-10-CM | POA: Diagnosis not present

## 2017-01-29 DIAGNOSIS — D649 Anemia, unspecified: Secondary | ICD-10-CM | POA: Diagnosis not present

## 2017-03-06 DIAGNOSIS — Z961 Presence of intraocular lens: Secondary | ICD-10-CM | POA: Diagnosis not present

## 2017-03-17 DIAGNOSIS — J019 Acute sinusitis, unspecified: Secondary | ICD-10-CM | POA: Diagnosis not present

## 2017-04-02 ENCOUNTER — Encounter: Payer: Self-pay | Admitting: *Deleted

## 2017-04-03 ENCOUNTER — Ambulatory Visit: Payer: PPO | Admitting: Anesthesiology

## 2017-04-03 ENCOUNTER — Encounter
Admission: RE | Disposition: A | Discharge status: Home / Self Care | Payer: Self-pay | Source: Ambulatory Visit | Attending: Gastroenterology

## 2017-04-03 ENCOUNTER — Ambulatory Visit
Admission: RE | Admit: 2017-04-03 | Discharge: 2017-04-03 | Disposition: A | Discharge status: Home / Self Care | Payer: PPO | Source: Ambulatory Visit | Attending: Gastroenterology | Admitting: Gastroenterology

## 2017-04-03 ENCOUNTER — Encounter: Payer: Self-pay | Admitting: *Deleted

## 2017-04-03 DIAGNOSIS — K5730 Diverticulosis of large intestine without perforation or abscess without bleeding: Secondary | ICD-10-CM | POA: Insufficient documentation

## 2017-04-03 DIAGNOSIS — Z800 Family history of malignant neoplasm of digestive organs: Secondary | ICD-10-CM | POA: Insufficient documentation

## 2017-04-03 DIAGNOSIS — K635 Polyp of colon: Secondary | ICD-10-CM | POA: Diagnosis not present

## 2017-04-03 DIAGNOSIS — Z880 Allergy status to penicillin: Secondary | ICD-10-CM | POA: Diagnosis not present

## 2017-04-03 DIAGNOSIS — D128 Benign neoplasm of rectum: Secondary | ICD-10-CM | POA: Diagnosis not present

## 2017-04-03 DIAGNOSIS — K5790 Diverticulosis of intestine, part unspecified, without perforation or abscess without bleeding: Secondary | ICD-10-CM | POA: Diagnosis not present

## 2017-04-03 DIAGNOSIS — K621 Rectal polyp: Secondary | ICD-10-CM | POA: Insufficient documentation

## 2017-04-03 DIAGNOSIS — D6851 Activated protein C resistance: Secondary | ICD-10-CM | POA: Insufficient documentation

## 2017-04-03 DIAGNOSIS — Z86010 Personal history of colonic polyps: Secondary | ICD-10-CM | POA: Diagnosis not present

## 2017-04-03 DIAGNOSIS — D122 Benign neoplasm of ascending colon: Secondary | ICD-10-CM | POA: Diagnosis not present

## 2017-04-03 DIAGNOSIS — E785 Hyperlipidemia, unspecified: Secondary | ICD-10-CM | POA: Diagnosis not present

## 2017-04-03 DIAGNOSIS — F419 Anxiety disorder, unspecified: Secondary | ICD-10-CM | POA: Insufficient documentation

## 2017-04-03 DIAGNOSIS — Z79899 Other long term (current) drug therapy: Secondary | ICD-10-CM | POA: Insufficient documentation

## 2017-04-03 HISTORY — PX: COLONOSCOPY WITH PROPOFOL: SHX5780

## 2017-04-03 HISTORY — DX: Hyperlipidemia, unspecified: E78.5

## 2017-04-03 HISTORY — DX: Phlebitis and thrombophlebitis of unspecified site: I80.9

## 2017-04-03 HISTORY — DX: Unilateral inguinal hernia, without obstruction or gangrene, not specified as recurrent: K40.90

## 2017-04-03 SURGERY — COLONOSCOPY WITH PROPOFOL
Anesthesia: General

## 2017-04-03 MED ORDER — SODIUM CHLORIDE 0.9 % IJ SOLN
INTRAMUSCULAR | Status: AC
  Filled 2017-04-03: qty 10

## 2017-04-03 MED ORDER — SODIUM CHLORIDE 0.9 % IV SOLN: INTRAVENOUS | Status: DC

## 2017-04-03 MED ORDER — MIDAZOLAM HCL 2 MG/2ML IJ SOLN
INTRAMUSCULAR | Status: AC
  Filled 2017-04-03: qty 2

## 2017-04-03 MED ORDER — PROPOFOL 500 MG/50ML IV EMUL
INTRAVENOUS | Status: AC
  Filled 2017-04-03: qty 50

## 2017-04-03 MED ORDER — PHENYLEPHRINE HCL 10 MG/ML IJ SOLN
INTRAMUSCULAR | Status: DC | PRN
  Administered 2017-04-03: 100 ug via INTRAVENOUS

## 2017-04-03 MED ORDER — GLYCOPYRROLATE 0.2 MG/ML IJ SOLN
INTRAMUSCULAR | Status: AC
  Filled 2017-04-03: qty 1

## 2017-04-03 MED ORDER — PROPOFOL 500 MG/50ML IV EMUL
INTRAVENOUS | Status: DC | PRN
  Administered 2017-04-03: 140 ug/kg/min via INTRAVENOUS

## 2017-04-03 MED ORDER — FENTANYL CITRATE (PF) 100 MCG/2ML IJ SOLN
INTRAMUSCULAR | Status: DC | PRN
  Administered 2017-04-03: 50 ug via INTRAVENOUS

## 2017-04-03 MED ORDER — SODIUM CHLORIDE 0.9 % IV SOLN
INTRAVENOUS | Status: DC
  Administered 2017-04-03 (×2): via INTRAVENOUS

## 2017-04-03 MED ORDER — LIDOCAINE 2% (20 MG/ML) 5 ML SYRINGE
INTRAMUSCULAR | Status: DC | PRN
  Administered 2017-04-03: 40 mg via INTRAVENOUS

## 2017-04-03 MED ORDER — FENTANYL CITRATE (PF) 100 MCG/2ML IJ SOLN
INTRAMUSCULAR | Status: AC
  Filled 2017-04-03: qty 2

## 2017-04-03 MED ORDER — PROPOFOL 10 MG/ML IV BOLUS
INTRAVENOUS | Status: AC
  Filled 2017-04-03: qty 40

## 2017-04-03 MED ORDER — PROPOFOL 10 MG/ML IV BOLUS
INTRAVENOUS | Status: DC | PRN
  Administered 2017-04-03: 100 mg via INTRAVENOUS

## 2017-04-03 MED ORDER — LIDOCAINE HCL (PF) 2 % IJ SOLN
INTRAMUSCULAR | Status: AC
  Filled 2017-04-03: qty 2

## 2017-04-03 MED ORDER — MIDAZOLAM HCL 5 MG/5ML IJ SOLN
INTRAMUSCULAR | Status: DC | PRN
  Administered 2017-04-03: 1 mg via INTRAVENOUS

## 2017-04-03 NOTE — Anesthesia Preprocedure Evaluation (Signed)
Anesthesia Evaluation  Patient identified by MRN, date of birth, ID band Patient awake    Reviewed: Allergy & Precautions, H&P , NPO status , Patient's Chart, lab work & pertinent test results  Airway Mallampati: III  TM Distance: >3 FB Neck ROM: limited    Dental  (+) Poor Dentition, Chipped, Caps   Pulmonary neg shortness of breath, pneumonia, resolved,    Pulmonary exam normal breath sounds clear to auscultation       Cardiovascular Exercise Tolerance: Good (-) angina(-) Past MI and (-) DOE negative cardio ROS Normal cardiovascular exam Rhythm:regular Rate:Normal     Neuro/Psych negative neurological ROS  negative psych ROS   GI/Hepatic negative GI ROS, Neg liver ROS, neg GERD  ,  Endo/Other  negative endocrine ROS  Renal/GU negative Renal ROS  negative genitourinary   Musculoskeletal  (+) Arthritis ,   Abdominal   Peds negative pediatric ROS (+)  Hematology  (+) anemia , Factor 5 leiden deficiency   Anesthesia Other Findings Past Medical History: No date: Anemia     Comment: h/o No date: Anxiety No date: Arthritis     Comment: knees and back No date: Bulging lumbar disc     Comment: PT DOES WATER AEROBICS AND SEES CHIROPRACTOR               MONTHLY AND HAS NO PROBLEMS WITH BACK 10/31/2016: Factor V Leiden mutation ([place])     Comment: herterozygote 1999: Pneumonia  Past Surgical History: 1957: APPENDECTOMY 2014: CATARACT EXTRACTION 2013: COLONOSCOPY No date: EYE SURGERY Bilateral     Reproductive/Obstetrics negative OB ROS                             Anesthesia Physical  Anesthesia Plan  ASA: III  Anesthesia Plan: General   Post-op Pain Management:    Induction: Intravenous  PONV Risk Score and Plan:   Airway Management Planned: Nasal Cannula  Additional Equipment:   Intra-op Plan:   Post-operative Plan:   Informed Consent: I have reviewed the patients  History and Physical, chart, labs and discussed the procedure including the risks, benefits and alternatives for the proposed anesthesia with the patient or authorized representative who has indicated his/her understanding and acceptance.   Dental Advisory Given  Plan Discussed with: Anesthesiologist, CRNA and Surgeon  Anesthesia Plan Comments:         Anesthesia Quick Evaluation

## 2017-04-03 NOTE — Transfer of Care (Signed)
Immediate Anesthesia Transfer of Care Note  Patient: Kendra Campbell  Procedure(s) Performed: Procedure(s): COLONOSCOPY WITH PROPOFOL (N/A)  Patient Location: PACU and Endoscopy Unit  Anesthesia Type:General  Level of Consciousness: sedated  Airway & Oxygen Therapy: Patient Spontanous Breathing and Patient connected to nasal cannula oxygen  Post-op Assessment: Report given to RN and Post -op Vital signs reviewed and stable  Post vital signs: Reviewed and stable  Last Vitals:  Vitals:   04/03/17 0649  BP: (!) 154/81  Pulse: 70  Resp: 18  Temp: 36.3 C    Last Pain:  Vitals:   04/03/17 0649  TempSrc: Tympanic         Complications: No apparent anesthesia complications

## 2017-04-03 NOTE — Op Note (Signed)
[facility] Gastroenterology Patient Name: Kendra Campbell Procedure Date: 04/03/2017 7:32 AM MRN: [id number] Account #: [id number] Date of Birth: 04/02/1935 Admit Type: Outpatient Age: 82 Room: [facility] ENDO ROOM 1 Gender: Female Note Status: Finalized Procedure:            Colonoscopy Indications:          Family history of colon cancer in multiple first-degree                        relatives, Personal history of colonic polyps Providers:            Lollie Sails, MD Referring MD:         Irven Easterly. Kary Kos, MD (Referring MD) Medicines:            Monitored Anesthesia Care Complications:        No immediate complications. Procedure:            Pre-Anesthesia Assessment:                       - ASA Grade Assessment: III - A patient with severe                        systemic disease.                       After obtaining informed consent, the colonoscope was                        passed under direct vision. Throughout the procedure,                        the patient's blood pressure, pulse, and oxygen                        saturations were monitored continuously. The                        Colonoscope was introduced through the anus and                        advanced to the the cecum, identified by appendiceal                        orifice and ileocecal valve. The colonoscopy was                        performed without difficulty. The patient tolerated the                        procedure well. The quality of the bowel preparation                        was good. Findings:      A 2 mm polyp was found in the rectum. The polyp was sessile. The polyp       was removed with a cold biopsy forceps. Resection and retrieval were       complete.      A 3 mm polyp was found in the proximal ascending colon. The polyp was       flat. The polyp was removed with a cold biopsy  forceps. Resection and       retrieval were complete.      Multiple small-mouthed  diverticula were found in the sigmoid colon and       descending colon.      The retroflexed view of the distal rectum and anal verge was normal and       showed no anal or rectal abnormalities.      The digital rectal exam was normal. Impression:           - One 2 mm polyp in the rectum, removed with a cold                        biopsy forceps. Resected and retrieved.                       - One 3 mm polyp in the proximal ascending colon,                        removed with a cold biopsy forceps. Resected and                        retrieved.                       - Diverticulosis in the sigmoid colon and in the                        descending colon.                       - The distal rectum and anal verge are normal on                        retroflexion view. Recommendation:       - Await pathology results.                       - Telephone GI clinic for pathology results in 1 week. Procedure Code(s):    --- Professional ---                       [phone], Colonoscopy, flexible; with biopsy, single or                        multiple Diagnosis Code(s):    --- Professional ---                       K62.1, Rectal polyp                       D12.2, Benign neoplasm of ascending colon                       Z80.0, Family history of malignant neoplasm of                        digestive organs                       Z86.010, Personal history of colonic polyps                       K57.30, Diverticulosis of  large intestine without                        perforation or abscess without bleeding CPT copyright 2016 American Medical Association. All rights reserved. The codes documented in this report are preliminary and upon coder review may  be revised to meet current compliance requirements. Lollie Sails, MD 04/03/2017 8:10:54 AM This report has been signed electronically. Number of Addenda: 0 Note Initiated On: 04/03/2017 7:32 AM Scope Withdrawal Time: 0 hours 7 minutes 33 seconds   Total Procedure Duration: 0 hours 26 minutes 28 seconds       [facility]

## 2017-04-03 NOTE — Anesthesia Post-op Follow-up Note (Cosign Needed)
Anesthesia QCDR form completed.        

## 2017-04-03 NOTE — H&P (Signed)
Outpatient short stay form Pre-procedure 04/03/2017 7:33 AM Kendra Sails MD  Primary Physician:  Dr. Maryland Pink  Reason for visit: Colonoscopy, patient has personal history of colon polyps and a family history of colon cancer in multiple primary relatives.  History of present illness:  Patient is a 82 year old female presenting today as above. There is a family history of colon cancer multiple primary relatives. She tolerated her prep although did not completed. She states she is going clear. She states that about 2 or 3 days ago she had an episode of apparent food poisoning after a meal. He states she is over that currently. She takes no aspirin or blood thinning agents.     Current Facility-Administered Medications:  .  0.9 %  sodium chloride infusion, , Intravenous, Continuous, Kendra Sails, MD, Last Rate: 20 mL/hr at 04/03/17 0708 .  0.9 %  sodium chloride infusion, , Intravenous, Continuous, Kendra Sails, MD  Prescriptions Prior to Admission  Medication Sig Dispense Refill Last Dose  . carboxymethylcellulose (REFRESH PLUS) 0.5 % SOLN Place 1-2 drops into both eyes 2 (two) times daily.   Past Week at Unknown time  . cholecalciferol (VITAMIN D) 1000 units tablet Take 1,000 Units by mouth daily.   Past Week at Unknown time  . Coenzyme Q10 (CO Q-10) 200 MG CAPS Take 200 mg by mouth daily.   Past Week at Unknown time  . Cyanocobalamin (VITAMIN B-12 PO) Place 1 tablet under the tongue daily.    Past Week at Unknown time  . diphenhydrAMINE (BENADRYL) 25 MG tablet Take 25 mg by mouth daily.   Past Week at Unknown time  . ibuprofen (ADVIL,MOTRIN) 200 MG tablet Take 200 mg by mouth every 8 (eight) hours as needed (for pain.).   Past Week at Unknown time  . sertraline (ZOLOFT) 25 MG tablet Take 25 mg by mouth every morning.    Past Week at Unknown time  . vitamin C (ASCORBIC ACID) 500 MG tablet Take 500 mg by mouth as needed.   Past Week at Unknown time  . atorvastatin  (LIPITOR) 40 MG tablet Take 40 mg by mouth daily at 6 PM.    Taking  . enoxaparin (LOVENOX) 40 MG/0.4ML injection Inject 0.4 mLs (40 mg total) into the skin daily. (Patient not taking: Reported on 04/03/2017) 6 Syringe 0 Completed Course at Unknown time     Allergies  Allergen Reactions  . Alendronate Nausea And Vomiting  . Ampicillin Nausea And Vomiting    Has patient had a PCN reaction causing immediate rash, facial/tongue/throat swelling, SOB or lightheadedness with hypotension:No Has patient had a PCN reaction causing severe rash involving mucus membranes or skin necrosis:No Has patient had a PCN reaction that required hospitalization:No Has patient had a PCN reaction occurring within the last 10 years:No If all of the above answers are "NO", then may proceed with Cephalosporin use.   . Codeine Sulfate Nausea And Vomiting  . Penicillins Nausea And Vomiting    Has patient had a PCN reaction causing immediate rash, facial/tongue/throat swelling, SOB or lightheadedness with hypotension:Yes Has patient had a PCN reaction causing severe rash involving mucus membranes or skin necrosis:No Has patient had a PCN reaction that required hospitalization:No Has patient had a PCN reaction occurring within the last 10 years:No If all of the above answers are "NO", then may proceed with Cephalosporin use.     Past Medical History:  Diagnosis Date  . Anemia    h/o  . Anxiety   .  Arthritis    knees and back  . Bulging lumbar disc    PT DOES WATER AEROBICS AND SEES CHIROPRACTOR MONTHLY AND HAS NO PROBLEMS WITH BACK  . Factor V Leiden mutation ([place]) 10/31/2016   herterozygote  . Hyperlipidemia   . Inguinal hernia   . Phlebitis   . Pneumonia 1999    Review of systems:      Physical Exam    Heart and lungs: Regular rate and rhythm without rub or gallop, lungs are bilaterally clear.    HEENT: Norm cephalic atraumatic eyes are anicteric    Other:     Pertinant exam for procedure: Soft  nontender nondistended bowel sounds positive normoactive    Planned proceedures: Colonoscopy and indicated procedures. I have discussed the risks benefits and complications of procedures to include not limited to bleeding, infection, perforation and the risk of sedation and the patient wishes to proceed.    Kendra Sails, MD Gastroenterology 04/03/2017  7:33 AM

## 2017-04-04 LAB — SURGICAL PATHOLOGY

## 2017-04-05 NOTE — Anesthesia Postprocedure Evaluation (Signed)
Anesthesia Post Note  Patient: ALEEYAH BENSEN  Procedure(s) Performed: Procedure(s) (LRB): COLONOSCOPY WITH PROPOFOL (N/A)  Patient location during evaluation: PACU Anesthesia Type: General Level of consciousness: awake and alert and oriented Pain management: pain level controlled Vital Signs Assessment: post-procedure vital signs reviewed and stable Respiratory status: spontaneous breathing Cardiovascular status: blood pressure returned to baseline Anesthetic complications: no     Last Vitals:  Vitals:   04/03/17 0833 04/03/17 0843  BP: 127/78 (!) 150/68  Pulse: (!) 57 (!) 57  Resp: 17 16  Temp:      Last Pain:  Vitals:   04/03/17 0813  TempSrc: Tympanic                 Qu[place]arri[place]

## 2017-04-07 ENCOUNTER — Encounter: Payer: Self-pay | Admitting: Gastroenterology

## 2017-07-23 DIAGNOSIS — E785 Hyperlipidemia, unspecified: Secondary | ICD-10-CM | POA: Diagnosis not present

## 2017-07-23 DIAGNOSIS — D649 Anemia, unspecified: Secondary | ICD-10-CM | POA: Diagnosis not present

## 2017-07-23 DIAGNOSIS — Z0000 Encounter for general adult medical examination without abnormal findings: Secondary | ICD-10-CM | POA: Diagnosis not present

## 2017-07-31 DIAGNOSIS — F439 Reaction to severe stress, unspecified: Secondary | ICD-10-CM | POA: Diagnosis not present

## 2017-07-31 DIAGNOSIS — E785 Hyperlipidemia, unspecified: Secondary | ICD-10-CM | POA: Diagnosis not present

## 2017-07-31 DIAGNOSIS — D649 Anemia, unspecified: Secondary | ICD-10-CM | POA: Diagnosis not present

## 2017-11-06 DIAGNOSIS — F419 Anxiety disorder, unspecified: Secondary | ICD-10-CM | POA: Diagnosis not present

## 2017-11-06 DIAGNOSIS — G4700 Insomnia, unspecified: Secondary | ICD-10-CM | POA: Diagnosis not present

## 2017-12-04 DIAGNOSIS — F419 Anxiety disorder, unspecified: Secondary | ICD-10-CM | POA: Diagnosis not present

## 2017-12-04 DIAGNOSIS — M8580 Other specified disorders of bone density and structure, unspecified site: Secondary | ICD-10-CM | POA: Diagnosis not present

## 2017-12-04 DIAGNOSIS — G4700 Insomnia, unspecified: Secondary | ICD-10-CM | POA: Diagnosis not present

## 2017-12-10 DIAGNOSIS — M8588 Other specified disorders of bone density and structure, other site: Secondary | ICD-10-CM | POA: Diagnosis not present

## 2018-01-27 DIAGNOSIS — D649 Anemia, unspecified: Secondary | ICD-10-CM | POA: Diagnosis not present

## 2018-01-27 DIAGNOSIS — E785 Hyperlipidemia, unspecified: Secondary | ICD-10-CM | POA: Diagnosis not present

## 2018-01-28 DIAGNOSIS — R8290 Unspecified abnormal findings in urine: Secondary | ICD-10-CM | POA: Diagnosis not present

## 2018-02-03 DIAGNOSIS — E871 Hypo-osmolality and hyponatremia: Secondary | ICD-10-CM | POA: Diagnosis not present

## 2018-02-03 DIAGNOSIS — Z0000 Encounter for general adult medical examination without abnormal findings: Secondary | ICD-10-CM | POA: Diagnosis not present

## 2018-02-03 DIAGNOSIS — R030 Elevated blood-pressure reading, without diagnosis of hypertension: Secondary | ICD-10-CM | POA: Diagnosis not present

## 2018-02-03 DIAGNOSIS — Z23 Encounter for immunization: Secondary | ICD-10-CM | POA: Diagnosis not present

## 2018-02-03 DIAGNOSIS — F419 Anxiety disorder, unspecified: Secondary | ICD-10-CM | POA: Diagnosis not present

## 2018-02-27 DIAGNOSIS — M3501 Sicca syndrome with keratoconjunctivitis: Secondary | ICD-10-CM | POA: Diagnosis not present

## 2018-03-05 DIAGNOSIS — E871 Hypo-osmolality and hyponatremia: Secondary | ICD-10-CM | POA: Diagnosis not present

## 2018-03-12 ENCOUNTER — Encounter: Payer: Self-pay | Admitting: Psychiatry

## 2018-03-12 ENCOUNTER — Other Ambulatory Visit: Payer: Self-pay

## 2018-03-12 ENCOUNTER — Ambulatory Visit (INDEPENDENT_AMBULATORY_CARE_PROVIDER_SITE_OTHER): Payer: PPO | Admitting: Psychiatry

## 2018-03-12 VITALS — BP 150/75 | HR 80 | Temp 97.5°F | Wt 135.0 lb

## 2018-03-12 DIAGNOSIS — K469 Unspecified abdominal hernia without obstruction or gangrene: Secondary | ICD-10-CM | POA: Insufficient documentation

## 2018-03-12 DIAGNOSIS — T7840XA Allergy, unspecified, initial encounter: Secondary | ICD-10-CM | POA: Insufficient documentation

## 2018-03-12 DIAGNOSIS — F5105 Insomnia due to other mental disorder: Secondary | ICD-10-CM | POA: Diagnosis not present

## 2018-03-12 DIAGNOSIS — F4323 Adjustment disorder with mixed anxiety and depressed mood: Secondary | ICD-10-CM

## 2018-03-12 MED ORDER — TRAZODONE HCL 50 MG PO TABS: 25.0000 mg | ORAL_TABLET | Freq: Every day | ORAL | 1 refills | Status: DC
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
## 2018-03-12 NOTE — Patient Instructions (Signed)
Trazodone tablets What is this medicine? TRAZODONE (TRAZ oh done) is used to treat depression. This medicine may be used for other purposes; ask your health care provider or pharmacist if you have questions. COMMON BRAND NAME(S): Desyrel What should I tell my health care provider before I take this medicine? They need to know if you have any of these conditions: -attempted suicide or thinking about it -bipolar disorder -bleeding problems -glaucoma -heart disease, or previous heart attack -irregular heart beat -kidney or liver disease -low levels of sodium in the blood -an unusual or allergic reaction to trazodone, other medicines, foods, dyes or preservatives -pregnant or trying to get pregnant -breast-feeding How should I use this medicine? Take this medicine by mouth with a glass of water. Follow the directions on the prescription label. Take this medicine shortly after a meal or a light snack. Take your medicine at regular intervals. Do not take your medicine more often than directed. Do not stop taking this medicine suddenly except upon the advice of your doctor. Stopping this medicine too quickly may cause serious side effects or your condition may worsen. A special MedGuide will be given to you by the pharmacist with each prescription and refill. Be sure to read this information carefully each time. Talk to your pediatrician regarding the use of this medicine in children. Special care may be needed. Overdosage: If you think you have taken too much of this medicine contact a poison control center or emergency room at once. NOTE: This medicine is only for you. Do not share this medicine with others. What if I miss a dose? If you miss a dose, take it as soon as you can. If it is almost time for your next dose, take only that dose. Do not take double or extra doses. What may interact with this medicine? Do not take this medicine with any of the following medications: -certain medicines  for fungal infections like fluconazole, itraconazole, ketoconazole, posaconazole, voriconazole -cisapride -dofetilide -dronedarone -linezolid -MAOIs like Carbex, Eldepryl, Marplan, Nardil, and Parnate -mesoridazine -methylene blue (injected into a vein) -pimozide -saquinavir -thioridazine -ziprasidone This medicine may also interact with the following medications: -alcohol -antiviral medicines for HIV or AIDS -aspirin and aspirin-like medicines -barbiturates like phenobarbital -certain medicines for blood pressure, heart disease, irregular heart beat -certain medicines for depression, anxiety, or psychotic disturbances -certain medicines for migraine headache like almotriptan, eletriptan, frovatriptan, naratriptan, rizatriptan, sumatriptan, zolmitriptan -certain medicines for seizures like carbamazepine and phenytoin -certain medicines for sleep -certain medicines that treat or prevent blood clots like dalteparin, enoxaparin, warfarin -digoxin -fentanyl -lithium -NSAIDS, medicines for pain and inflammation, like ibuprofen or naproxen -other medicines that prolong the QT interval (cause an abnormal heart rhythm) -rasagiline -supplements like St. John's wort, kava kava, valerian -tramadol -tryptophan This list may not describe all possible interactions. Give your health care provider a list of all the medicines, herbs, non-prescription drugs, or dietary supplements you use. Also tell them if you smoke, drink alcohol, or use illegal drugs. Some items may interact with your medicine. What should I watch for while using this medicine? Tell your doctor if your symptoms do not get better or if they get worse. Visit your doctor or health care professional for regular checks on your progress. Because it may take several weeks to see the full effects of this medicine, it is important to continue your treatment as prescribed by your doctor. Patients and their families should watch out for new  or worsening thoughts of suicide or depression. Also   watch out for sudden changes in feelings such as feeling anxious, agitated, panicky, irritable, hostile, aggressive, impulsive, severely restless, overly excited and hyperactive, or not being able to sleep. If this happens, especially at the beginning of treatment or after a change in dose, call your health care professional. You may get drowsy or dizzy. Do not drive, use machinery, or do anything that needs mental alertness until you know how this medicine affects you. Do not stand or sit up quickly, especially if you are an older patient. This reduces the risk of dizzy or fainting spells. Alcohol may interfere with the effect of this medicine. Avoid alcoholic drinks. This medicine may cause dry eyes and blurred vision. If you wear contact lenses you may feel some discomfort. Lubricating drops may help. See your eye doctor if the problem does not go away or is severe. Your mouth may get dry. Chewing sugarless gum, sucking hard candy and drinking plenty of water may help. Contact your doctor if the problem does not go away or is severe. What side effects may I notice from receiving this medicine? Side effects that you should report to your doctor or health care professional as soon as possible: -allergic reactions like skin rash, itching or hives, swelling of the face, lips, or tongue -elevated mood, decreased need for sleep, racing thoughts, impulsive behavior -confusion -fast, irregular heartbeat -feeling faint or lightheaded, falls -feeling agitated, angry, or irritable -loss of balance or coordination -painful or prolonged erections -restlessness, pacing, inability to keep still -suicidal thoughts or other mood changes -tremors -trouble sleeping -seizures -unusual bleeding or bruising Side effects that usually do not require medical attention (report to your doctor or health care professional if they continue or are bothersome): -change in  sex drive or performance -change in appetite or weight -constipation -headache -muscle aches or pains -nausea This list may not describe all possible side effects. Call your doctor for medical advice about side effects. You may report side effects to FDA at 1-800-FDA-1088. Where should I keep my medicine? Keep out of the reach of children. Store at room temperature between 15 and 30 degrees C (59 to 86 degrees F). Protect from light. Keep container tightly closed. Throw away any unused medicine after the expiration date. NOTE: This sheet is a summary. It may not cover all possible information. If you have questions about this medicine, talk to your doctor, pharmacist, or health care provider.  2018 Elsevier/Gold Standard (2016-02-29 16:57:05)  

## 2018-03-13 ENCOUNTER — Encounter: Payer: Self-pay | Admitting: Psychiatry

## 2018-03-17 DIAGNOSIS — G4700 Insomnia, unspecified: Secondary | ICD-10-CM | POA: Diagnosis not present

## 2018-03-17 DIAGNOSIS — F419 Anxiety disorder, unspecified: Secondary | ICD-10-CM | POA: Diagnosis not present

## 2018-04-03 ENCOUNTER — Other Ambulatory Visit: Payer: Self-pay | Admitting: Psychiatry

## 2018-04-03 DIAGNOSIS — F4323 Adjustment disorder with mixed anxiety and depressed mood: Secondary | ICD-10-CM | POA: Diagnosis not present

## 2018-04-03 DIAGNOSIS — F5105 Insomnia due to other mental disorder: Secondary | ICD-10-CM | POA: Diagnosis not present

## 2018-04-09 ENCOUNTER — Other Ambulatory Visit: Payer: Self-pay

## 2018-04-09 ENCOUNTER — Encounter: Payer: Self-pay | Admitting: Psychiatry

## 2018-04-09 ENCOUNTER — Ambulatory Visit (INDEPENDENT_AMBULATORY_CARE_PROVIDER_SITE_OTHER): Payer: PPO | Admitting: Psychiatry

## 2018-04-09 VITALS — BP 139/78 | HR 58 | Temp 98.2°F | Wt 137.4 lb

## 2018-04-09 DIAGNOSIS — F4323 Adjustment disorder with mixed anxiety and depressed mood: Secondary | ICD-10-CM

## 2018-04-09 DIAGNOSIS — F5105 Insomnia due to other mental disorder: Secondary | ICD-10-CM

## 2018-04-09 MED ORDER — ESCITALOPRAM OXALATE 20 MG PO TABS: 20.0000 mg | ORAL_TABLET | Freq: Every day | ORAL | 1 refills | Status: DC

## 2018-04-09 NOTE — Progress Notes (Signed)
[facility] OP Progress Note  04/09/2018 10:40 AM Kendra Campbell  MRN:  [id number]  Chief Complaint: ' I am here for follow up." Chief Complaint    Follow-up; Medication Refill     HPI: Kendra Campbell is an 83 yr old Caucasian female, divorced, on SSI, retired, lives in [place], has a history of recent onset anxiety symptoms, insomnia, hyperlipidemia, presented to the clinic today for a follow-up visit.  Patient today reports she is doing well on the current medication regimen.  She reports the trazodone helps her to sleep and that has made a big difference in her mood.  She continues to take the Lexapro which was initiated by her primary medical doctor.  Patient denies any other concerns today.  Patient denies any suicidality.  Patient denies any perceptual disturbances.  Patient has started psychotherapy with Ms. Miguel Dibble which is going well.  Patient with elevated blood pressure today.  Her blood pressure was repeated today before she left the clinic.  Her blood pressure came down when it was repeated.  However discussed with patient to keep a log of her blood pressure and talk to her primary medical doctor.   Visit Diagnosis:    ICD-10-CM   1. Adjustment disorder with mixed anxiety and depressed mood F43.23   2. Insomnia due to mental condition F51.05     Past Psychiatric History: I have reviewed past psychiatric history from my progress note on 03/12/2018.  Past trials of Lexapro, Xanax.  Past Medical History:  Past Medical History:  Diagnosis Date  . Anemia    h/o  . Anxiety   . Arthritis    knees and back  . Bulging lumbar disc    PT DOES WATER AEROBICS AND SEES CHIROPRACTOR MONTHLY AND HAS NO PROBLEMS WITH BACK  . Factor V Leiden mutation ([place]) 10/31/2016   herterozygote  . Hyperlipidemia   . Inguinal hernia   . Phlebitis   . Pneumonia 1999    Past Surgical History:  Procedure Laterality Date  . APPENDECTOMY  1957  . CATARACT EXTRACTION  2014  . COLONOSCOPY   2013  . COLONOSCOPY WITH PROPOFOL N/A 04/03/2017   Procedure: COLONOSCOPY WITH PROPOFOL;  Surgeon: Lollie Sails, MD;  Location: [facility] ENDOSCOPY;  Service: Endoscopy;  Laterality: N/A;  . EYE SURGERY Bilateral   . HERNIA REPAIR    . INGUINAL HERNIA REPAIR Right 12/02/2016   Medium Ultra Pro mesh.  Surgeon: Robert Bellow, MD;  Location: [facility] ORS;  Service: General;  Laterality: Right;    Family Psychiatric History: I have reviewed family psychiatric history from my progress note on 03/12/2018.  Family History:  Family History  Problem Relation Age of Onset  . Osteoporosis Mother   . Stomach cancer Father   . Factor V Leiden deficiency Neg Hx        PE, long term anticoagulation.   Substance abuse history: Denies  Social History: She is divorced.  She is retired.  She is on SSI.  She has 2 daughters and a son who are supportive.  I have reviewed social history from my progress note on 03/12/2018. Social History   Socioeconomic History  . Marital status: Divorced    Spouse name: Not on file  . Number of children: 3  . Years of education: Not on file  . Highest education level: Some college, no degree  Occupational History    Comment: retired  Scientific laboratory technician  . Financial resource strain: Not hard at all  .  Food insecurity:    Worry: Never true    Inability: Never true  . Transportation needs:    Medical: No    Non-medical: No  Tobacco Use  . Smoking status: Never Smoker  . Smokeless tobacco: Never Used  Substance and Sexual Activity  . Alcohol use: No  . Drug use: No  . Sexual activity: Not Currently  Lifestyle  . Physical activity:    Days per week: 6 days    Minutes per session: 60 min  . Stress: To some extent  Relationships  . Social connections:    Talks on phone: More than three times a week    Gets together: More than three times a week    Attends religious service: More than 4 times per year    Active member of club or organization: Yes    Attends  meetings of clubs or organizations: More than 4 times per year    Relationship status: Divorced  Other Topics Concern  . Not on file  Social History Narrative  . Not on file    Allergies:  Allergies  Allergen Reactions  . Alendronate Nausea And Vomiting  . Ampicillin Nausea And Vomiting    Has patient had a PCN reaction causing immediate rash, facial/tongue/throat swelling, SOB or lightheadedness with hypotension:No Has patient had a PCN reaction causing severe rash involving mucus membranes or skin necrosis:No Has patient had a PCN reaction that required hospitalization:No Has patient had a PCN reaction occurring within the last 10 years:No If all of the above answers are "NO", then may proceed with Cephalosporin use.   . Codeine Sulfate Nausea And Vomiting  . Penicillins Nausea And Vomiting    Has patient had a PCN reaction causing immediate rash, facial/tongue/throat swelling, SOB or lightheadedness with hypotension:Yes Has patient had a PCN reaction causing severe rash involving mucus membranes or skin necrosis:No Has patient had a PCN reaction that required hospitalization:No Has patient had a PCN reaction occurring within the last 10 years:No If all of the above answers are "NO", then may proceed with Cephalosporin use.    Metabolic Disorder Labs: No results found for: HGBA1C, MPG No results found for: PROLACTIN No results found for: CHOL, TRIG, HDL, CHOLHDL, VLDL, LDLCALC No results found for: TSH  Therapeutic Level Labs: No results found for: LITHIUM No results found for: VALPROATE No components found for:  CBMZ  Current Medications: Current Outpatient Medications  Medication Sig Dispense Refill  . ALPRAZolam (XANAX) 0.25 MG tablet Take by mouth.    Marland Kitchen atorvastatin (LIPITOR) 40 MG tablet TAKE 1 TABLET (40 MG TOTAL) BY MOUTH ONCE DAILY.  3  . carboxymethylcellulose (REFRESH PLUS) 0.5 % SOLN Place 1-2 drops into both eyes 2 (two) times daily.    . cholecalciferol  (VITAMIN D) 1000 units tablet Take 1,000 Units by mouth daily.    . Coenzyme Q10 (CO Q-10) 200 MG CAPS Take 200 mg by mouth daily.    . Cyanocobalamin (VITAMIN B-12 PO) Place 1 tablet under the tongue daily.     . diphenhydrAMINE (BENADRYL) 25 MG tablet Take 25 mg by mouth daily.    Marland Kitchen escitalopram (LEXAPRO) 20 MG tablet Take 1 tablet (20 mg total) by mouth daily. 90 tablet 1  . ibuprofen (ADVIL,MOTRIN) 200 MG tablet Take 200 mg by mouth every 8 (eight) hours as needed (for pain.).    Marland Kitchen traZODone (DESYREL) 50 MG tablet TAKE 1/2 TO 1 TABLET BY MOUTH AT BEDTIME 90 tablet 1  . vitamin C (ASCORBIC  ACID) 500 MG tablet Take 500 mg by mouth as needed.     No current facility-administered medications for this visit.      Musculoskeletal: Strength & Muscle Tone: within normal limits Gait & Station: normal Patient leans: N/A  Psychiatric Specialty Exam: Review of Systems  Psychiatric/Behavioral: Positive for depression (improving).  All other systems reviewed and are negative.   Blood pressure 139/78, pulse (!) 58, temperature 98.2 F (36.8 C), temperature source Oral, weight 137 lb 6.4 oz (62.3 kg).Body mass index is 25.13 kg/m.  General Appearance: Casual  Eye Contact:  Fair  Speech:  Clear and Coherent  Volume:  Normal  Mood:  Anxious  Affect:  Congruent  Thought Process:  Goal Directed and Descriptions of Associations: Intact  Orientation:  Full (Time, Place, and Person)  Thought Content: Logical   Suicidal Thoughts:  No  Homicidal Thoughts:  No  Memory:  Immediate;   Fair Recent;   Fair Remote;   Fair  Judgement:  Fair  Insight:  Fair  Psychomotor Activity:  Normal  Concentration:  Concentration: Fair and Attention Span: Fair  Recall:  [employer] of Knowledge: Fair  Language: Fair  Akathisia:  No  Handed:  Right  AIMS (if indicated): na  Assets:  Communication Skills Desire for Improvement Social Support  ADL's:  Intact  Cognition: WNL  Sleep:  Fair    Screenings:   Assessment and Plan: Keamber is an 83 yr old Caucasian female who has a history of anxiety, insomnia, hyperlipidemia, presented to the clinic today for a follow-up visit.  Patient does have psychosocial stressors including being divorced, elderly and so on.  She does have a history of worrying about her social situation, being alone and so on.  She does have children who are supportive.  She reports her sleep is better on the trazodone which has helped her mood to get better.  Will continue plan as noted below.  Plan Adjustment disorder with anxiety and depressed mood Continue Lexapro 20 mg p.o. daily-initiated by PMD. Patient does have Xanax 0.25 mg p.o. as needed for anxiety symptoms prescribed by PMD.  Patient however reports she has not been using it.  Patient is aware about the risk of being on benzodiazepine therapy given her age.  For insomnia Continue trazodone 25-50 mg p.o. nightly as needed   She will continue psychotherapy with Ms. Miguel Dibble.  Follow-up in clinic in 2 months  More than 50 % of the time was spent for psychoeducation and supportive psychotherapy and care coordination.  This note was generated in part or whole with voice recognition software. Voice recognition is usually quite accurate but there are transcription errors that can and very often do occur. I apologize for any typographical errors that were not detected and corrected.       Ursula Alert, MD 04/10/2018, 8:45 AM

## 2018-04-10 ENCOUNTER — Encounter: Payer: Self-pay | Admitting: Psychiatry

## 2018-04-24 DIAGNOSIS — F5105 Insomnia due to other mental disorder: Secondary | ICD-10-CM | POA: Diagnosis not present

## 2018-04-24 DIAGNOSIS — F4323 Adjustment disorder with mixed anxiety and depressed mood: Secondary | ICD-10-CM | POA: Diagnosis not present

## 2018-05-15 DIAGNOSIS — M25561 Pain in right knee: Secondary | ICD-10-CM | POA: Diagnosis not present

## 2018-05-20 DIAGNOSIS — F4323 Adjustment disorder with mixed anxiety and depressed mood: Secondary | ICD-10-CM | POA: Diagnosis not present

## 2018-05-20 DIAGNOSIS — F5105 Insomnia due to other mental disorder: Secondary | ICD-10-CM | POA: Diagnosis not present

## 2018-05-21 DIAGNOSIS — M25561 Pain in right knee: Secondary | ICD-10-CM | POA: Diagnosis not present

## 2018-06-03 DIAGNOSIS — M25561 Pain in right knee: Secondary | ICD-10-CM | POA: Diagnosis not present

## 2018-06-03 DIAGNOSIS — M1711 Unilateral primary osteoarthritis, right knee: Secondary | ICD-10-CM | POA: Diagnosis not present

## 2018-06-09 ENCOUNTER — Ambulatory Visit: Payer: PPO | Admitting: Psychiatry

## 2018-06-17 DIAGNOSIS — M25561 Pain in right knee: Secondary | ICD-10-CM | POA: Diagnosis not present

## 2018-06-17 DIAGNOSIS — M2391 Unspecified internal derangement of right knee: Secondary | ICD-10-CM | POA: Diagnosis not present

## 2018-06-17 DIAGNOSIS — F5105 Insomnia due to other mental disorder: Secondary | ICD-10-CM | POA: Diagnosis not present

## 2018-06-17 DIAGNOSIS — F4323 Adjustment disorder with mixed anxiety and depressed mood: Secondary | ICD-10-CM | POA: Diagnosis not present

## 2018-06-18 ENCOUNTER — Other Ambulatory Visit: Payer: Self-pay | Admitting: Physician Assistant

## 2018-06-18 DIAGNOSIS — M25561 Pain in right knee: Secondary | ICD-10-CM

## 2018-06-24 ENCOUNTER — Ambulatory Visit (INDEPENDENT_AMBULATORY_CARE_PROVIDER_SITE_OTHER): Payer: PPO | Admitting: Psychiatry

## 2018-06-24 ENCOUNTER — Encounter: Payer: Self-pay | Admitting: Psychiatry

## 2018-06-24 ENCOUNTER — Other Ambulatory Visit: Payer: Self-pay

## 2018-06-24 VITALS — BP 144/77 | HR 67 | Temp 98.7°F | Wt 136.4 lb

## 2018-06-24 DIAGNOSIS — F5105 Insomnia due to other mental disorder: Secondary | ICD-10-CM | POA: Diagnosis not present

## 2018-06-24 DIAGNOSIS — F411 Generalized anxiety disorder: Secondary | ICD-10-CM | POA: Diagnosis not present

## 2018-06-24 NOTE — Progress Notes (Signed)
[facility] MD OP Progress Note  06/24/2018 1:03 PM Kendra Campbell  MRN:  [id number]  Chief Complaint: ' I am here for follow up.' Chief Complaint    Follow-up; Medication Refill     HPI: Kendra Campbell is an 82 year old Caucasian female, divorced, on SSI, retired, lives in [place], has a history of anxiety symptoms, insomnia, right-sided knee pain, hyperlipidemia, presented to the clinic today for a follow-up visit.  Patient today reports she is doing well on the Lexapro.  She continues to be on 20 mg.  She denies any side effects.  She reports she is sleeping better.  She does struggle with some dry mouth however has been using Biotene which helps to some extent.  Discussed with her that it may be a side effect of her medications.  She reports she is mildly anxious about her knee pain.  She is awaiting an MRI and possibly an arthroscopic surgery.  She is positive that everything is going to be well.  She continues to have good social support from her family.  Visit Diagnosis:    ICD-10-CM   1. GAD (generalized anxiety disorder) F41.1   2. Insomnia due to mental condition F51.05    improved    Past Psychiatric History: Reviewed past psychiatric history from my progress note on 03/12/2018.  Past trials of Lexapro, Xanax  Past Medical History:  Past Medical History:  Diagnosis Date  . Anemia    h/o  . Anxiety   . Arthritis    knees and back  . Bulging lumbar disc    PT DOES WATER AEROBICS AND SEES CHIROPRACTOR MONTHLY AND HAS NO PROBLEMS WITH BACK  . Factor V Leiden mutation ([place]) 10/31/2016   herterozygote  . Hyperlipidemia   . Inguinal hernia   . Phlebitis   . Pneumonia 1999    Past Surgical History:  Procedure Laterality Date  . APPENDECTOMY  1957  . CATARACT EXTRACTION  2014  . COLONOSCOPY  2013  . COLONOSCOPY WITH PROPOFOL N/A 04/03/2017   Procedure: COLONOSCOPY WITH PROPOFOL;  Surgeon: Lollie Sails, MD;  Location: [facility] ENDOSCOPY;  Service: Endoscopy;  Laterality:  N/A;  . EYE SURGERY Bilateral   . HERNIA REPAIR    . INGUINAL HERNIA REPAIR Right 12/02/2016   Medium Ultra Pro mesh.  Surgeon: Robert Bellow, MD;  Location: [facility] ORS;  Service: General;  Laterality: Right;    Family Psychiatric History: I have reviewed family psychiatric history from my progress note on 03/12/2018  Family History:  Family History  Problem Relation Age of Onset  . Osteoporosis Mother   . Stomach cancer Father   . Factor V Leiden deficiency Neg Hx        PE, long term anticoagulation.    Social History: Have reviewed social history from my progress note on 03/12/2018. Social History   Socioeconomic History  . Marital status: Divorced    Spouse name: Not on file  . Number of children: 3  . Years of education: Not on file  . Highest education level: Some college, no degree  Occupational History    Comment: retired  Scientific laboratory technician  . Financial resource strain: Not hard at all  . Food insecurity:    Worry: Never true    Inability: Never true  . Transportation needs:    Medical: No    Non-medical: No  Tobacco Use  . Smoking status: Never Smoker  . Smokeless tobacco: Never Used  Substance and Sexual Activity  . Alcohol use: No  .  Drug use: No  . Sexual activity: Not Currently  Lifestyle  . Physical activity:    Days per week: 6 days    Minutes per session: 60 min  . Stress: To some extent  Relationships  . Social connections:    Talks on phone: More than three times a week    Gets together: More than three times a week    Attends religious service: More than 4 times per year    Active member of club or organization: Yes    Attends meetings of clubs or organizations: More than 4 times per year    Relationship status: Divorced  Other Topics Concern  . Not on file  Social History Narrative  . Not on file    Allergies:  Allergies  Allergen Reactions  . Alendronate Nausea And Vomiting  . Ampicillin Nausea And Vomiting    Has patient had a PCN  reaction causing immediate rash, facial/tongue/throat swelling, SOB or lightheadedness with hypotension:No Has patient had a PCN reaction causing severe rash involving mucus membranes or skin necrosis:No Has patient had a PCN reaction that required hospitalization:No Has patient had a PCN reaction occurring within the last 10 years:No If all of the above answers are "NO", then may proceed with Cephalosporin use.   . Codeine Sulfate Nausea And Vomiting  . Penicillins Nausea And Vomiting    Has patient had a PCN reaction causing immediate rash, facial/tongue/throat swelling, SOB or lightheadedness with hypotension:Yes Has patient had a PCN reaction causing severe rash involving mucus membranes or skin necrosis:No Has patient had a PCN reaction that required hospitalization:No Has patient had a PCN reaction occurring within the last 10 years:No If all of the above answers are "NO", then may proceed with Cephalosporin use.    Metabolic Disorder Labs: No results found for: HGBA1C, MPG No results found for: PROLACTIN No results found for: CHOL, TRIG, HDL, CHOLHDL, VLDL, LDLCALC No results found for: TSH  Therapeutic Level Labs: No results found for: LITHIUM No results found for: VALPROATE No components found for:  CBMZ  Current Medications: Current Outpatient Medications  Medication Sig Dispense Refill  . ALPRAZolam (XANAX) 0.25 MG tablet Take by mouth.    Marland Kitchen atorvastatin (LIPITOR) 40 MG tablet TAKE 1 TABLET (40 MG TOTAL) BY MOUTH ONCE DAILY.  3  . carboxymethylcellulose (REFRESH PLUS) 0.5 % SOLN Place 1-2 drops into both eyes 2 (two) times daily.    . cholecalciferol (VITAMIN D) 1000 units tablet Take 1,000 Units by mouth daily.    . Coenzyme Q10 (CO Q-10) 200 MG CAPS Take 200 mg by mouth daily.    . Cyanocobalamin (VITAMIN B-12 PO) Place 1 tablet under the tongue daily.     . diphenhydrAMINE (BENADRYL) 25 MG tablet Take 25 mg by mouth daily.    Marland Kitchen escitalopram (LEXAPRO) 20 MG tablet  Take 1 tablet (20 mg total) by mouth daily. 90 tablet 1  . ibuprofen (ADVIL,MOTRIN) 200 MG tablet Take 200 mg by mouth every 8 (eight) hours as needed (for pain.).    Marland Kitchen traZODone (DESYREL) 50 MG tablet TAKE 1/2 TO 1 TABLET BY MOUTH AT BEDTIME 90 tablet 1  . vitamin C (ASCORBIC ACID) 500 MG tablet Take 500 mg by mouth as needed.     No current facility-administered medications for this visit.      Musculoskeletal: Strength & Muscle Tone: within normal limits Gait & Station: normal Patient leans: N/A  Psychiatric Specialty Exam: Review of Systems  Psychiatric/Behavioral: The patient is nervous/anxious (  improving).   All other systems reviewed and are negative.   Blood pressure (!) 144/77, pulse 67, temperature 98.7 F (37.1 C), temperature source Oral, weight 136 lb 6.4 oz (61.9 kg).Body mass index is 24.95 kg/m.  General Appearance: Casual  Eye Contact:  Fair  Speech:  Normal Rate  Volume:  Normal  Mood:  Anxious  Affect:  Congruent  Thought Process:  Goal Directed and Descriptions of Associations: Intact  Orientation:  Full (Time, Place, and Person)  Thought Content: Logical   Suicidal Thoughts:  No  Homicidal Thoughts:  No  Memory:  Immediate;   Fair Recent;   Fair Remote;   Fair  Judgement:  Fair  Insight:  Fair  Psychomotor Activity:  Normal  Concentration:  Concentration: Fair and Attention Span: Fair  Recall:  [employer] of Knowledge: Fair  Language: Fair  Akathisia:  No  Handed:  Right  AIMS (if indicated):na  Assets:  Communication Skills Desire for Improvement Social Support  ADL's:  Intact  Cognition: WNL  Sleep:  Fair   Screenings:   Assessment and Plan: Kendra Campbell is an 83 year old Caucasian female who has a history of anxiety, insomnia, hyperlipidemia, right-sided knee pain, presented to the clinic today for a follow-up visit.  Patient continues to have psychosocial stressors of her health issues.  She is currently awaiting a knee surgery.  She  however reports being compliant on her medications which are working at this point.  She denies any concerns today.  Plan as noted below.  Plan For generalized anxiety disorder Lexapro 20 mg p.o. daily-initiated by PMD Patient also has access to Xanax 0.25 mg as needed for severe anxiety symptoms.  Patient has not been using it and is aware about the risk of being on benzodiazepine therapy.  For insomnia Continue trazodone 25-50 mg p.o. nightly as needed  Patient will continue psychotherapy with Ms. Miguel Dibble.  Follow-up in clinic in 3 months or sooner if needed.  More than 50 % of the time was spent for psychoeducation and supportive psychotherapy and care coordination.  This note was generated in part or whole with voice recognition software. Voice recognition is usually quite accurate but there are transcription errors that can and very often do occur. I apologize for any typographical errors that were not detected and corrected.        Ursula Alert, MD 06/24/2018, 1:03 PM

## 2018-07-01 ENCOUNTER — Other Ambulatory Visit: Payer: Self-pay | Admitting: Psychiatry

## 2018-07-02 ENCOUNTER — Ambulatory Visit
Admission: RE | Admit: 2018-07-02 | Discharge: 2018-07-02 | Disposition: A | Discharge status: Home / Self Care | Payer: PPO | Source: Ambulatory Visit | Attending: Physician Assistant | Admitting: Physician Assistant

## 2018-07-02 DIAGNOSIS — M2241 Chondromalacia patellae, right knee: Secondary | ICD-10-CM | POA: Insufficient documentation

## 2018-07-02 DIAGNOSIS — S83281A Other tear of lateral meniscus, current injury, right knee, initial encounter: Secondary | ICD-10-CM | POA: Insufficient documentation

## 2018-07-02 DIAGNOSIS — S83241A Other tear of medial meniscus, current injury, right knee, initial encounter: Secondary | ICD-10-CM | POA: Diagnosis not present

## 2018-07-02 DIAGNOSIS — M25561 Pain in right knee: Secondary | ICD-10-CM

## 2018-08-06 DIAGNOSIS — M2391 Unspecified internal derangement of right knee: Secondary | ICD-10-CM | POA: Diagnosis not present

## 2018-09-03 DIAGNOSIS — F5105 Insomnia due to other mental disorder: Secondary | ICD-10-CM | POA: Diagnosis not present

## 2018-09-03 DIAGNOSIS — F4323 Adjustment disorder with mixed anxiety and depressed mood: Secondary | ICD-10-CM | POA: Diagnosis not present

## 2018-09-15 DIAGNOSIS — Z0000 Encounter for general adult medical examination without abnormal findings: Secondary | ICD-10-CM | POA: Diagnosis not present

## 2018-09-15 DIAGNOSIS — D649 Anemia, unspecified: Secondary | ICD-10-CM | POA: Diagnosis not present

## 2018-09-22 DIAGNOSIS — F419 Anxiety disorder, unspecified: Secondary | ICD-10-CM | POA: Diagnosis not present

## 2018-09-22 DIAGNOSIS — G4700 Insomnia, unspecified: Secondary | ICD-10-CM | POA: Diagnosis not present

## 2018-09-23 ENCOUNTER — Ambulatory Visit: Payer: PPO | Admitting: Psychiatry

## 2018-09-29 DIAGNOSIS — H903 Sensorineural hearing loss, bilateral: Secondary | ICD-10-CM | POA: Diagnosis not present

## 2018-10-21 ENCOUNTER — Encounter: Payer: Self-pay | Admitting: Psychiatry

## 2018-10-21 ENCOUNTER — Other Ambulatory Visit: Payer: Self-pay

## 2018-10-21 ENCOUNTER — Ambulatory Visit (INDEPENDENT_AMBULATORY_CARE_PROVIDER_SITE_OTHER): Payer: PPO | Admitting: Psychiatry

## 2018-10-21 VITALS — BP 154/88 | HR 67 | Temp 97.7°F | Wt 138.6 lb

## 2018-10-21 DIAGNOSIS — F4323 Adjustment disorder with mixed anxiety and depressed mood: Secondary | ICD-10-CM | POA: Diagnosis not present

## 2018-10-21 DIAGNOSIS — F5105 Insomnia due to other mental disorder: Secondary | ICD-10-CM | POA: Diagnosis not present

## 2018-10-21 DIAGNOSIS — F411 Generalized anxiety disorder: Secondary | ICD-10-CM

## 2018-10-21 MED ORDER — TRAZODONE HCL 50 MG PO TABS: 25.0000 mg | ORAL_TABLET | Freq: Every day | ORAL | 1 refills | Status: DC

## 2018-10-21 MED ORDER — ESCITALOPRAM OXALATE 20 MG PO TABS: 20.0000 mg | ORAL_TABLET | Freq: Every day | ORAL | 1 refills | Status: DC

## 2018-10-21 NOTE — Progress Notes (Signed)
[facility] MD OP Progress Note  10/21/2018 1:38 PM Kendra Campbell  MRN:  [id number]  Chief Complaint:  Chief Complaint    Follow-up; Medication Refill    ' I am here for follow up."  HPI: Kendra Campbell is an 84 year old Caucasian female, divorced, on SSI, retired, lives in [place], has a history of anxiety, insomnia, presented to the clinic today for a follow-up visit.  Patient today reports that she continues to do well.  She denies any significant sadness, crying spells or anxiety symptoms.  Patient reports sleep is getting better.  She continues to take trazodone 25 mg.  She denies any side effects to the medications.  She denies any suicidality, homicidality.  She reports she has been active in church, as well as exercising.  [facility]  She denies any other concerns today.     Visit Diagnosis:    ICD-10-CM   1. GAD (generalized anxiety disorder) F41.1 escitalopram (LEXAPRO) 20 MG tablet    traZODone (DESYREL) 50 MG tablet  2. Insomnia due to mental condition F51.05 escitalopram (LEXAPRO) 20 MG tablet    Past Psychiatric History: Reviewed past psychiatric history from my progress note on 03/12/2018.  Past trials of Lexapro, Xanax.  Past Medical History:  Past Medical History:  Diagnosis Date  . Anemia    h/o  . Anxiety   . Arthritis    knees and back  . Bulging lumbar disc    PT DOES WATER AEROBICS AND SEES CHIROPRACTOR MONTHLY AND HAS NO PROBLEMS WITH BACK  . Factor V Leiden mutation ([place]) 10/31/2016   herterozygote  . Hyperlipidemia   . Inguinal hernia   . Phlebitis   . Pneumonia 1999    Past Surgical History:  Procedure Laterality Date  . APPENDECTOMY  1957  . CATARACT EXTRACTION  2014  . COLONOSCOPY  2013  . COLONOSCOPY WITH PROPOFOL N/A 04/03/2017   Procedure: COLONOSCOPY WITH PROPOFOL;  Surgeon: Lollie Sails, MD;  Location[facility] ENDOSCOPY;  Service: Endoscopy;  Laterality: N/A;  . EYE SURGERY Bilateral   . HERNIA REPAIR     . INGUINAL HERNIA REPAIR Right 12/02/2016   Medium Ultra Pro mesh.  Surgeon: Robert Bellow, MD;  Location: [facility] ORS;  Service: General;  Laterality: Right;    Family Psychiatric History: Reviewed family psychiatric history from my progress note on 03/12/2018  Family History:  Family History  Problem Relation Age of Onset  . Osteoporosis Mother   . Stomach cancer Father   . Factor V Leiden deficiency Neg Hx        PE, long term anticoagulation.    Social History: Reviewed social history from my progress note on 03/12/2018 Social History   Socioeconomic History  . Marital status: Divorced    Spouse name: Not on file  . Number of children: 3  . Years of education: Not on file  . Highest education level: Some college, no degree  Occupational History    Comment: retired  Scientific laboratory technician  . Financial resource strain: Not hard at all  . Food insecurity:    Worry: Never true    Inability: Never true  . Transportation needs:    Medical: No    Non-medical: No  Tobacco Use  . Smoking status: Never Smoker  . Smokeless tobacco: Never Used  Substance and Sexual Activity  . Alcohol use: No  . Drug use: No  . Sexual activity: Not Currently  Lifestyle  . Physical activity:  Days per week: 6 days    Minutes per session: 60 min  . Stress: To some extent  Relationships  . Social connections:    Talks on phone: More than three times a week    Gets together: More than three times a week    Attends religious service: More than 4 times per year    Active member of club or organization: Yes    Attends meetings of clubs or organizations: More than 4 times per year    Relationship status: Divorced  Other Topics Concern  . Not on file  Social History Narrative  . Not on file    Allergies:  Allergies  Allergen Reactions  . Alendronate Nausea And Vomiting  . Ampicillin Nausea And Vomiting    Has patient had a PCN reaction causing immediate rash, facial/tongue/throat swelling, SOB  or lightheadedness with hypotension:No Has patient had a PCN reaction causing severe rash involving mucus membranes or skin necrosis:No Has patient had a PCN reaction that required hospitalization:No Has patient had a PCN reaction occurring within the last 10 years:No If all of the above answers are "NO", then may proceed with Cephalosporin use.   . Codeine Sulfate Nausea And Vomiting  . Penicillins Nausea And Vomiting    Has patient had a PCN reaction causing immediate rash, facial/tongue/throat swelling, SOB or lightheadedness with hypotension:Yes Has patient had a PCN reaction causing severe rash involving mucus membranes or skin necrosis:No Has patient had a PCN reaction that required hospitalization:No Has patient had a PCN reaction occurring within the last 10 years:No If all of the above answers are "NO", then may proceed with Cephalosporin use.    Metabolic Disorder Labs: No results found for: HGBA1C, MPG No results found for: PROLACTIN No results found for: CHOL, TRIG, HDL, CHOLHDL, VLDL, LDLCALC No results found for: TSH  Therapeutic Level Labs: No results found for: LITHIUM No results found for: VALPROATE No components found for:  CBMZ  Current Medications: Current Outpatient Medications  Medication Sig Dispense Refill  . ALPRAZolam (XANAX) 0.25 MG tablet Take by mouth.    Marland Kitchen atorvastatin (LIPITOR) 40 MG tablet TAKE 1 TABLET (40 MG TOTAL) BY MOUTH ONCE DAILY.  3  . Camphor-Menthol 0.2-3.5 % GEL Apply topically.    . carboxymethylcellulose (REFRESH PLUS) 0.5 % SOLN Place 1-2 drops into both eyes 2 (two) times daily.    . cholecalciferol (VITAMIN D) 1000 units tablet Take 1,000 Units by mouth daily.    . Coenzyme Q10 (CO Q-10) 200 MG CAPS Take 200 mg by mouth daily.    . Cyanocobalamin (VITAMIN B-12 PO) Place 1 tablet under the tongue daily.     . diphenhydrAMINE (BENADRYL) 25 MG tablet Take 25 mg by mouth daily.    Marland Kitchen escitalopram (LEXAPRO) 20 MG tablet Take 1 tablet (20  mg total) by mouth daily. 90 tablet 1  . ibuprofen (ADVIL,MOTRIN) 200 MG tablet Take 200 mg by mouth every 8 (eight) hours as needed (for pain.).    Marland Kitchen traZODone (DESYREL) 50 MG tablet Take 0.5-1 tablets (25-50 mg total) by mouth at bedtime. 90 tablet 1  . vitamin C (ASCORBIC ACID) 500 MG tablet Take 500 mg by mouth as needed.     No current facility-administered medications for this visit.      Musculoskeletal: Strength & Muscle Tone: within normal limits Gait & Station: normal Patient leans: N/A  Psychiatric Specialty Exam: Review of Systems  Psychiatric/Behavioral: Negative for suicidal ideas. The patient is not nervous/anxious.  All other systems reviewed and are negative.   Blood pressure (!) 154/88, pulse 67, temperature 97.7 F (36.5 C), temperature source Oral, weight 138 lb 9.6 oz (62.9 kg).Body mass index is 25.35 kg/m.  General Appearance: Casual  Eye Contact:  Fair  Speech:  Clear and Coherent  Volume:  Normal  Mood:  Euthymic  Affect:  Congruent  Thought Process:  Goal Directed and Descriptions of Associations: Intact  Orientation:  Full (Time, Place, and Person)  Thought Content: Logical   Suicidal Thoughts:  No  Homicidal Thoughts:  No  Memory:  Immediate;   Fair Recent;   Fair Remote;   Fair  Judgement:  Fair  Insight:  Fair  Psychomotor Activity:  Normal  Concentration:  Concentration: Fair and Attention Span: Fair  Recall:  [employer] of Knowledge: Fair  Language: Fair  Akathisia:  No  Handed:  Right  AIMS (if indicated): Denies tremors, rigidity, stiffness  Assets:  Communication Skills Desire for Improvement Social Support Talents/Skills  ADL's:  Intact  Cognition: WNL  Sleep:  Good   Screenings:   Assessment and Plan: Katrine is an 84 year old Caucasian female who has a history of anxiety, insomnia, hyperlipidemia, right-sided knee pain, presented to the clinic today for a follow-up visit.  Patient continues to have psychosocial stressors  of her own health problems.  Patient however has been coping well.  She denies any concerns.  Plan as noted below.  Plan For generalized anxiety disorder-stable Lexapro 20 mg p.o. daily. Patient also has access to Xanax as needed however she has not been using it.  For insomnia-stable Continue trazodone 25 to 50 mg p.o. nightly as needed  Pt with elevated blood pressure reading today She reports being anxious when she comes to doctor's offices.  She has been compliant with her medications and has been having regular follow-up with her primary medical doctor.  Patient to continue psychotherapy with Ms. Miguel Dibble.  Follow-up in clinic in 6 months or sooner if needed.  More than 50 % of the time was spent for psychoeducation and supportive psychotherapy and care coordination.  This note was generated in part or whole with voice recognition software. Voice recognition is usually quite accurate but there are transcription errors that can and very often do occur. I apologize for any typographical errors that were not detected and corrected.         Ursula Alert, MD 10/21/2018, 1:38 PM

## 2018-11-25 DIAGNOSIS — H43811 Vitreous degeneration, right eye: Secondary | ICD-10-CM | POA: Diagnosis not present

## 2018-11-26 DIAGNOSIS — H43813 Vitreous degeneration, bilateral: Secondary | ICD-10-CM | POA: Diagnosis not present

## 2018-11-26 DIAGNOSIS — G453 Amaurosis fugax: Secondary | ICD-10-CM | POA: Diagnosis not present

## 2018-11-26 DIAGNOSIS — H35033 Hypertensive retinopathy, bilateral: Secondary | ICD-10-CM | POA: Diagnosis not present

## 2018-11-26 DIAGNOSIS — H43393 Other vitreous opacities, bilateral: Secondary | ICD-10-CM | POA: Diagnosis not present

## 2018-11-30 ENCOUNTER — Other Ambulatory Visit: Payer: Self-pay | Admitting: Family Medicine

## 2018-11-30 DIAGNOSIS — G453 Amaurosis fugax: Secondary | ICD-10-CM

## 2018-12-03 ENCOUNTER — Ambulatory Visit
Admission: RE | Admit: 2018-12-03 | Discharge: 2018-12-03 | Disposition: A | Discharge status: Home / Self Care | Payer: PPO | Source: Ambulatory Visit | Attending: Family Medicine | Admitting: Family Medicine

## 2018-12-03 DIAGNOSIS — G453 Amaurosis fugax: Secondary | ICD-10-CM

## 2018-12-03 DIAGNOSIS — I6523 Occlusion and stenosis of bilateral carotid arteries: Secondary | ICD-10-CM | POA: Diagnosis not present

## 2018-12-24 DIAGNOSIS — H43813 Vitreous degeneration, bilateral: Secondary | ICD-10-CM | POA: Diagnosis not present

## 2018-12-24 DIAGNOSIS — H35373 Puckering of macula, bilateral: Secondary | ICD-10-CM | POA: Diagnosis not present

## 2018-12-24 DIAGNOSIS — H35033 Hypertensive retinopathy, bilateral: Secondary | ICD-10-CM | POA: Diagnosis not present

## 2018-12-24 DIAGNOSIS — H4311 Vitreous hemorrhage, right eye: Secondary | ICD-10-CM | POA: Diagnosis not present

## 2018-12-24 DIAGNOSIS — G453 Amaurosis fugax: Secondary | ICD-10-CM | POA: Diagnosis not present

## 2019-03-17 DIAGNOSIS — Z0000 Encounter for general adult medical examination without abnormal findings: Secondary | ICD-10-CM | POA: Diagnosis not present

## 2019-03-17 DIAGNOSIS — E785 Hyperlipidemia, unspecified: Secondary | ICD-10-CM | POA: Diagnosis not present

## 2019-03-24 DIAGNOSIS — G4700 Insomnia, unspecified: Secondary | ICD-10-CM | POA: Diagnosis not present

## 2019-03-24 DIAGNOSIS — F419 Anxiety disorder, unspecified: Secondary | ICD-10-CM | POA: Diagnosis not present

## 2019-03-24 DIAGNOSIS — Z0000 Encounter for general adult medical examination without abnormal findings: Secondary | ICD-10-CM | POA: Diagnosis not present

## 2019-03-24 DIAGNOSIS — E785 Hyperlipidemia, unspecified: Secondary | ICD-10-CM | POA: Diagnosis not present

## 2019-04-02 ENCOUNTER — Other Ambulatory Visit: Payer: Self-pay | Admitting: Family Medicine

## 2019-04-02 DIAGNOSIS — Z1231 Encounter for screening mammogram for malignant neoplasm of breast: Secondary | ICD-10-CM

## 2019-04-12 ENCOUNTER — Other Ambulatory Visit: Payer: Self-pay | Admitting: Psychiatry

## 2019-04-12 DIAGNOSIS — F411 Generalized anxiety disorder: Secondary | ICD-10-CM

## 2019-05-12 ENCOUNTER — Ambulatory Visit
Admission: RE | Admit: 2019-05-12 | Discharge: 2019-05-12 | Disposition: A | Discharge status: Home / Self Care | Payer: PPO | Source: Ambulatory Visit | Attending: Family Medicine | Admitting: Family Medicine

## 2019-05-12 DIAGNOSIS — Z1231 Encounter for screening mammogram for malignant neoplasm of breast: Secondary | ICD-10-CM | POA: Diagnosis not present

## 2019-05-16 ENCOUNTER — Other Ambulatory Visit: Payer: Self-pay | Admitting: Psychiatry

## 2019-05-16 DIAGNOSIS — F411 Generalized anxiety disorder: Secondary | ICD-10-CM

## 2019-05-16 DIAGNOSIS — F5105 Insomnia due to other mental disorder: Secondary | ICD-10-CM

## 2019-08-10 DIAGNOSIS — Z961 Presence of intraocular lens: Secondary | ICD-10-CM | POA: Diagnosis not present

## 2019-09-22 DIAGNOSIS — E785 Hyperlipidemia, unspecified: Secondary | ICD-10-CM | POA: Diagnosis not present

## 2019-09-29 DIAGNOSIS — E785 Hyperlipidemia, unspecified: Secondary | ICD-10-CM | POA: Diagnosis not present

## 2019-09-29 DIAGNOSIS — F419 Anxiety disorder, unspecified: Secondary | ICD-10-CM | POA: Diagnosis not present

## 2019-09-29 DIAGNOSIS — G5601 Carpal tunnel syndrome, right upper limb: Secondary | ICD-10-CM | POA: Diagnosis not present

## 2019-09-29 DIAGNOSIS — G4700 Insomnia, unspecified: Secondary | ICD-10-CM | POA: Diagnosis not present

## 2019-11-08 DIAGNOSIS — H3581 Retinal edema: Secondary | ICD-10-CM | POA: Diagnosis not present

## 2020-04-03 ENCOUNTER — Other Ambulatory Visit: Payer: Self-pay | Admitting: Family Medicine

## 2020-04-03 DIAGNOSIS — Z1231 Encounter for screening mammogram for malignant neoplasm of breast: Secondary | ICD-10-CM

## 2020-04-24 ENCOUNTER — Other Ambulatory Visit: Payer: Self-pay | Admitting: Psychiatry

## 2020-04-24 DIAGNOSIS — F411 Generalized anxiety disorder: Secondary | ICD-10-CM

## 2020-04-25 ENCOUNTER — Other Ambulatory Visit: Payer: Self-pay | Admitting: Psychiatry

## 2020-04-25 DIAGNOSIS — F411 Generalized anxiety disorder: Secondary | ICD-10-CM

## 2020-05-17 ENCOUNTER — Ambulatory Visit
Admission: RE | Admit: 2020-05-17 | Discharge: 2020-05-17 | Disposition: A | Discharge status: Home / Self Care | Payer: Medicare Other | Source: Ambulatory Visit | Attending: Family Medicine | Admitting: Family Medicine

## 2020-05-17 ENCOUNTER — Other Ambulatory Visit: Payer: Self-pay

## 2020-05-17 DIAGNOSIS — Z1231 Encounter for screening mammogram for malignant neoplasm of breast: Secondary | ICD-10-CM | POA: Insufficient documentation

## 2020-12-15 ENCOUNTER — Other Ambulatory Visit: Payer: Self-pay

## 2020-12-15 ENCOUNTER — Inpatient Hospital Stay: Payer: Medicare Other

## 2020-12-15 ENCOUNTER — Inpatient Hospital Stay
Admission: EM | Admit: 2020-12-15 | Discharge: 2020-12-17 | DRG: 644 | Disposition: A | Discharge status: Home Health | Payer: Medicare Other | Attending: Internal Medicine | Admitting: Internal Medicine

## 2020-12-15 ENCOUNTER — Encounter: Payer: Self-pay | Admitting: Emergency Medicine

## 2020-12-15 DIAGNOSIS — E222 Syndrome of inappropriate secretion of antidiuretic hormone: Secondary | ICD-10-CM | POA: Diagnosis not present

## 2020-12-15 DIAGNOSIS — Z888 Allergy status to other drugs, medicaments and biological substances status: Secondary | ICD-10-CM

## 2020-12-15 DIAGNOSIS — Z79899 Other long term (current) drug therapy: Secondary | ICD-10-CM | POA: Diagnosis not present

## 2020-12-15 DIAGNOSIS — E785 Hyperlipidemia, unspecified: Secondary | ICD-10-CM | POA: Diagnosis not present

## 2020-12-15 DIAGNOSIS — Z881 Allergy status to other antibiotic agents status: Secondary | ICD-10-CM | POA: Diagnosis not present

## 2020-12-15 DIAGNOSIS — Z20822 Contact with and (suspected) exposure to covid-19: Secondary | ICD-10-CM | POA: Diagnosis not present

## 2020-12-15 DIAGNOSIS — Z885 Allergy status to narcotic agent status: Secondary | ICD-10-CM | POA: Diagnosis not present

## 2020-12-15 DIAGNOSIS — R531 Weakness: Secondary | ICD-10-CM | POA: Diagnosis not present

## 2020-12-15 DIAGNOSIS — E878 Other disorders of electrolyte and fluid balance, not elsewhere classified: Secondary | ICD-10-CM | POA: Diagnosis present

## 2020-12-15 DIAGNOSIS — Z880 Allergy status to penicillin: Secondary | ICD-10-CM

## 2020-12-15 DIAGNOSIS — F32A Depression, unspecified: Secondary | ICD-10-CM | POA: Diagnosis present

## 2020-12-15 DIAGNOSIS — G4700 Insomnia, unspecified: Secondary | ICD-10-CM | POA: Diagnosis present

## 2020-12-15 DIAGNOSIS — Z8672 Personal history of thrombophlebitis: Secondary | ICD-10-CM

## 2020-12-15 DIAGNOSIS — Z8262 Family history of osteoporosis: Secondary | ICD-10-CM

## 2020-12-15 DIAGNOSIS — I2510 Atherosclerotic heart disease of native coronary artery without angina pectoris: Secondary | ICD-10-CM | POA: Diagnosis present

## 2020-12-15 DIAGNOSIS — T43295A Adverse effect of other antidepressants, initial encounter: Secondary | ICD-10-CM | POA: Diagnosis present

## 2020-12-15 DIAGNOSIS — M170 Bilateral primary osteoarthritis of knee: Secondary | ICD-10-CM

## 2020-12-15 DIAGNOSIS — D6851 Activated protein C resistance: Secondary | ICD-10-CM | POA: Diagnosis present

## 2020-12-15 DIAGNOSIS — M1710 Unilateral primary osteoarthritis, unspecified knee: Secondary | ICD-10-CM | POA: Diagnosis present

## 2020-12-15 DIAGNOSIS — F411 Generalized anxiety disorder: Secondary | ICD-10-CM | POA: Diagnosis present

## 2020-12-15 DIAGNOSIS — Z87440 Personal history of urinary (tract) infections: Secondary | ICD-10-CM | POA: Diagnosis not present

## 2020-12-15 DIAGNOSIS — E871 Hypo-osmolality and hyponatremia: Secondary | ICD-10-CM | POA: Diagnosis present

## 2020-12-15 DIAGNOSIS — M179 Osteoarthritis of knee, unspecified: Secondary | ICD-10-CM | POA: Diagnosis present

## 2020-12-15 DIAGNOSIS — Z79891 Long term (current) use of opiate analgesic: Secondary | ICD-10-CM | POA: Diagnosis not present

## 2020-12-15 DIAGNOSIS — R4182 Altered mental status, unspecified: Secondary | ICD-10-CM | POA: Diagnosis present

## 2020-12-15 LAB — CBC WITH DIFFERENTIAL/PLATELET
Abs Immature Granulocytes: 0.03 10*3/uL (ref 0.00–0.07)
Basophils Absolute: 0.1 10*3/uL (ref 0.0–0.1)
Basophils Relative: 1 %
Eosinophils Absolute: 0.3 10*3/uL (ref 0.0–0.5)
Eosinophils Relative: 3 %
HCT: 38.2 % (ref 36.0–46.0)
Hemoglobin: 13 g/dL (ref 12.0–15.0)
Immature Granulocytes: 0 %
Lymphocytes Relative: 29 %
Lymphs Abs: 2.6 10*3/uL (ref 0.7–4.0)
MCH: 30.4 pg (ref 26.0–34.0)
MCHC: 34 g/dL (ref 30.0–36.0)
MCV: 89.5 fL (ref 80.0–100.0)
Monocytes Absolute: 1 10*3/uL (ref 0.1–1.0)
Monocytes Relative: 11 %
Neutro Abs: 4.9 10*3/uL (ref 1.7–7.7)
Neutrophils Relative %: 56 %
Platelets: 209 10*3/uL (ref 150–400)
RBC: 4.27 MIL/uL (ref 3.87–5.11)
RDW: 12.7 % (ref 11.5–15.5)
WBC: 8.9 10*3/uL (ref 4.0–10.5)
nRBC: 0 % (ref 0.0–0.2)

## 2020-12-15 LAB — TSH: TSH: 2.823 u[IU]/mL (ref 0.350–4.500)

## 2020-12-15 LAB — BASIC METABOLIC PANEL
Anion gap: 9 (ref 5–15)
BUN: 13 mg/dL (ref 8–23)
CO2: 25 mmol/L (ref 22–32)
Calcium: 9.3 mg/dL (ref 8.9–10.3)
Chloride: 93 mmol/L — ABNORMAL LOW (ref 98–111)
Creatinine, Ser: 0.63 mg/dL (ref 0.44–1.00)
GFR, Estimated: >60 mL/min (ref >60)
Glucose, Bld: 100 mg/dL — ABNORMAL HIGH (ref 70–99)
Potassium: 3.7 mmol/L (ref 3.5–5.1)
Sodium: 127 mmol/L — ABNORMAL LOW (ref 135–145)

## 2020-12-15 LAB — URINALYSIS, COMPLETE (UACMP) WITH MICROSCOPIC
Bacteria, UA: NONE SEEN
Bilirubin Urine: NEGATIVE
Glucose, UA: NEGATIVE mg/dL
Hgb urine dipstick: NEGATIVE
Ketones, ur: NEGATIVE mg/dL
Leukocytes,Ua: NEGATIVE
Nitrite: NEGATIVE
Protein, ur: NEGATIVE mg/dL
Specific Gravity, Urine: 1.01 (ref 1.005–1.030)
WBC, UA: NONE SEEN WBC/hpf (ref 0–5)
pH: 6 (ref 5.0–8.0)

## 2020-12-15 LAB — MAGNESIUM: Magnesium: 2.1 mg/dL (ref 1.7–2.4)

## 2020-12-15 LAB — OSMOLALITY: Osmolality: 267 mOsm/kg — ABNORMAL LOW (ref 275–295)

## 2020-12-15 LAB — OSMOLALITY, URINE: Osmolality, Ur: 373 mOsm/kg (ref 300–900)

## 2020-12-15 LAB — URIC ACID: Uric Acid, Serum: 2.6 mg/dL (ref 2.5–7.1)

## 2020-12-15 MED ORDER — ATORVASTATIN CALCIUM 20 MG PO TABS
40.0000 mg | ORAL_TABLET | Freq: Every day | ORAL | Status: DC
Start: 2020-12-15 — End: 2020-12-17
  Administered 2020-12-15 – 2020-12-16 (×2): 40 mg via ORAL
  Filled 2020-12-15 (×2): qty 2

## 2020-12-15 MED ORDER — SODIUM CHLORIDE 0.9 % IV BOLUS
1000.0000 mL | Freq: Once | INTRAVENOUS | Status: AC
Start: 2020-12-15 — End: 2020-12-15
  Administered 2020-12-15: 1000 mL via INTRAVENOUS

## 2020-12-15 MED ORDER — ACETAMINOPHEN 325 MG PO TABS: 650.0000 mg | ORAL_TABLET | Freq: Four times a day (QID) | ORAL | Status: DC | PRN

## 2020-12-15 MED ORDER — ACETAMINOPHEN 650 MG RE SUPP: 650.0000 mg | Freq: Four times a day (QID) | RECTAL | Status: DC | PRN

## 2020-12-15 NOTE — ED Triage Notes (Signed)
Pt A&O x4, updating daughter on phone regarding location upon arrival to ED via wheelchair from [facility]. Pt visualized in NAD at this time.

## 2020-12-15 NOTE — H&P (Signed)
History and Physical    PLEASE NOTE THAT DRAGON DICTATION SOFTWARE WAS USED IN THE CONSTRUCTION OF THIS NOTE.   Kendra Campbell JJO:[id number] DOB: May 21, 1935 DOA: 12/15/2020  PCP: Maryland Pink, MD Patient coming from: home   I have personally briefly reviewed patient's old medical records in [place]  Chief Complaint: Generalized weakness  HPI: Kendra Campbell is a 86 y.o. female with medical history significant for hyperlipidemia, generalized anxiety disorder, osteoarthritis, who is admitted to [facility] on 12/15/2020 with acute hypoosmolar hyponatremia after presenting from home to [facility] ED complaining of generalized weakness.   The following history was obtained via my discussions with the patient as well as my discussions with the patient's daughter, who was present at bedside, in addition to my discussions with the emergency department physician and via chart review.  The patient reports approximately 3 weeks of progressive generalized weakness in the absence of any associated acute focal weakness, acute focal numbness, paresthesias, dysarthria, facial droop, acute change in vision, vertigo, dysphagia.  At baseline, the patient lives by herself and is able to independently perform all of her own ADLs, and is quite active, including going to the gym multiple times per week at baseline functional status.  Consistent with this, she reports that at baseline, she is able to ambulate and transfer without any assistance.  However, over the course of progressive generalized weakness over the last 3 weeks, patient reports progressive difficulty in performing her ADLs, and notes that she has not had the energy to go to the gym over that timeframe.  Overall, she reports this recent onset of progressive generalized weakness as significantly impacted her ability to function independently, and has affected the quality of her life including her pursuit of recreational  activities, as above.   Early in February 2022, the patient reports that she had experienced a few days of dysuria, prompting PCP to check urinalysis with reported ensuing diagnosis of urinary tract infection.  At that time, the patient was started on a course of Macrobid.  At the conclusion of her course of Macrobid, which finished on 11/27/2020, the patient reports interval resolution of her dysuria, but noted no significant improvement in her generalized weakness, prompting suspicion from outpatient provider regarding presence of residual UTI.  Consequently, the patient was started on ciprofloxacin, completing a course of this antibiotic on 12/08/2020, following which patient denies any significant improvement in her generalized weakness.  She denies any recent subjective fever, chills, rigors, or generalized myalgias.  Denies any recent nausea, vomiting, abdominal pain, diarrhea.  She also denies any recent trauma, including no recent falls.  Denies any recent chest pain, palpitations, diaphoresis, shortness of breath, dizziness, presyncope, or syncope.  Starting approximately 3 weeks ago, the patient reports that she was instructed by her PCP to significantly increase her volume of consumption of free water in order to induce polyuria and effort to clear any underlying urinary tract infection.  The patient reports that she was very compliant with this recommendation, increasing her daily volume of free water consumption by 2-3 times at a baseline over the ensuing 2 to 3 weeks.  Denies any ensuing shortness of breath, orthopnea, PND, or peripheral edema.  She also denies any recent rhinitis, rhinorrhea, cough.    Approximately 3 to 4 weeks ago, the patient and her daughter report that the patient's bupropion was increased from the existing dose of 100 mg p.o. daily to 3 times this previous dose, with ensuing instructions from  PCP to take bupropion 150 mg p.o. twice daily.  Patient reports noting onset of  generalized weakness around the time of this dose adjustment.  She reports typical use of 200 to 400 mg of ibuprofen on a daily basis, but notes that she has been taking this NSAID at this dose and frequency for quite some time, adjustment.  She denies any use of thiazide diuretics, but is unsure if she continues to take the Lexapro that was on her previous outpatient medication reconciliation.  Additional notable home medications, including that of trazodone for which the patient takes on a nightly basis for insomnia.  Denies any known history of underlying hypothyroidism or adrenal insufficiency.  Denies any known history of underlying liver disease or congestive heart failure.   Denies any recent headache, neck stiffness, sore throat, sob, wheezing, or rash. No recent traveling or known COVID-19 exposures.   In the context of progressive generalized weakness, the patient's PCP ordered a BMP as an outpatient, which was checked earlier today, and demonstrated a sodium result of 124.  Subsequently contacted the patient with this result, which is reportedly consistent with acute hyponatremia, and consequently instructed the patient to present to the local emergency department for further evaluation of acute hyponatremia in the setting of progressive generalized weakness.    ED Course:  Vital signs in the ED were notable for the following: Temperature max 97.6, heart rate 69-73, blood pressure 172/95, respiratory rate 16-18, and oxygen saturation 99% on room air.  Labs were notable for the following: BMP performed in the ED today was notable for the following: Sodium 127, chloride 93, bicarbonate 25, creatinine 0.63, glucose 100.  CBC notable for white blood cell count of 8900.  Urinalysis showed no white blood cells, no bacteria, nitrate negative, leukocyte Estrace negative, and was notable for a specific gravity of 1.010.  Nasopharyngeal COVID-19 PCR was checked in the ED today, with result currently  pending.  EKG, by way of comparison to most recent prior EKG from January 2018 showed normal sinus rhythm with sinus arrhythmia, heart rate 70, normal intervals, nonspecific T wave inversion in V1, which is unchanged relative to most recent prior EKG, and showed no evidence of ST changes, including no evidence of ST elevation.  While in the ED, the following were administered: Normal saline x1 L bolus.  Subsequently, the patient was admitted to the med telemetry floor for further evaluation and management of presenting acute hyponatremia in the context of presenting generalized weakness.     Review of Systems: As per HPI otherwise 10 point review of systems negative.   Past Medical History:  Diagnosis Date  . Anemia    h/o  . Anxiety   . Arthritis    knees and back  . Bulging lumbar disc    PT DOES WATER AEROBICS AND SEES CHIROPRACTOR MONTHLY AND HAS NO PROBLEMS WITH BACK  . Factor V Leiden mutation ([place]) 10/31/2016   herterozygote  . Hyperlipidemia   . Inguinal hernia   . Phlebitis   . Pneumonia 1999    Past Surgical History:  Procedure Laterality Date  . APPENDECTOMY  1957  . BREAST CYST ASPIRATION    . CATARACT EXTRACTION  2014  . COLONOSCOPY  2013  . COLONOSCOPY WITH PROPOFOL N/A 04/03/2017   Procedure: COLONOSCOPY WITH PROPOFOL;  Surgeon: Lollie Sails, MD;  Location: [facility] ENDOSCOPY;  Service: Endoscopy;  Laterality: N/A;  . EYE SURGERY Bilateral   . HERNIA REPAIR    . INGUINAL HERNIA  REPAIR Right 12/02/2016   Medium Ultra Pro mesh.  Surgeon: Robert Bellow, MD;  Location: [facility] ORS;  Service: General;  Laterality: Right;    Social History:  reports that she has never smoked. She has never used smokeless tobacco. She reports that she does not drink alcohol and does not use drugs.   Allergies  Allergen Reactions  . Alendronate Nausea And Vomiting  . Ampicillin Nausea And Vomiting    Has patient had a PCN reaction causing immediate rash, facial/tongue/throat  swelling, SOB or lightheadedness with hypotension:No Has patient had a PCN reaction causing severe rash involving mucus membranes or skin necrosis:No Has patient had a PCN reaction that required hospitalization:No Has patient had a PCN reaction occurring within the last 10 years:No If all of the above answers are "NO", then may proceed with Cephalosporin use.   . Codeine Sulfate Nausea And Vomiting  . Penicillins Nausea And Vomiting    Has patient had a PCN reaction causing immediate rash, facial/tongue/throat swelling, SOB or lightheadedness with hypotension:Yes Has patient had a PCN reaction causing severe rash involving mucus membranes or skin necrosis:No Has patient had a PCN reaction that required hospitalization:No Has patient had a PCN reaction occurring within the last 10 years:No If all of the above answers are "NO", then may proceed with Cephalosporin use.    Family History  Problem Relation Age of Onset  . Osteoporosis Mother   . Stomach cancer Father   . Factor V Leiden deficiency Neg Hx        PE, long term anticoagulation.  . Breast cancer Neg Hx       Prior to Admission medications   Medication Sig Start Date End Date Taking? Authorizing Provider  ALPRAZolam Duanne Moron) 0.25 MG tablet Take by mouth. 02/24/18   [provider]  atorvastatin (LIPITOR) 40 MG tablet TAKE 1 TABLET (40 MG TOTAL) BY MOUTH ONCE DAILY. 01/16/18   [provider]  Camphor-Menthol 0.2-3.5 % GEL Apply topically.    [provider]  carboxymethylcellulose (REFRESH PLUS) 0.5 % SOLN Place 1-2 drops into both eyes 2 (two) times daily.    [provider]  cholecalciferol (VITAMIN D) 1000 units tablet Take 1,000 Units by mouth daily.    [provider]  Coenzyme Q10 (CO Q-10) 200 MG CAPS Take 200 mg by mouth daily.    [provider]  Cyanocobalamin (VITAMIN B-12 PO) Place 1 tablet under the tongue daily.     [provider]  diphenhydrAMINE  (BENADRYL) 25 MG tablet Take 25 mg by mouth daily.    [provider]  escitalopram (LEXAPRO) 20 MG tablet TAKE 1 TABLET BY MOUTH EVERY DAY 05/17/19   Ursula Alert, MD  ibuprofen (ADVIL,MOTRIN) 200 MG tablet Take 200 mg by mouth every 8 (eight) hours as needed (for pain.).    [provider]  traZODone (DESYREL) 50 MG tablet TAKE 1/2 TO 1 TABLETS (25-50 MG TOTAL) BY MOUTH AT BEDTIME. 04/12/19   Ursula Alert, MD  vitamin C (ASCORBIC ACID) 500 MG tablet Take 500 mg by mouth as needed.    [provider]     Objective    Physical Exam: Vitals:   12/15/20 1720 12/15/20 1721 12/15/20 2004  BP:  (!) 172/95 (!) 176/94  Pulse:  73 69  Resp: 16 16 18   Temp:  97.6 F (36.4 C)   TempSrc: Oral Oral   SpO2:  99% 99%  Weight:  61.7 kg     General: appears to  be stated age; alert, oriented Skin: warm, dry, no rash Head:  AT/[place]:  Oral mucosa membranes appear moist, normal dentition Neck: supple; trachea midline Heart:  RRR; did not appreciate any M/R/G Lungs: CTAB, did not appreciate any wheezes, rales, or rhonchi Abdomen: + BS; soft, ND, NT Vascular: 2+ pedal pulses b/l; 2+ radial pulses b/l Extremities: no peripheral edema, no muscle wasting Neuro: strength and sensation intact in upper and lower extremities b/l   Labs on Admission: I have personally reviewed following labs and imaging studies  CBC: Recent Labs  Lab 12/15/20 1931  WBC 8.9  NEUTROABS 4.9  HGB 13.0  HCT 38.2  MCV 89.5  PLT 272   Basic Metabolic Panel: Recent Labs  Lab 12/15/20 1931  NA 127*  K 3.7  CL 93*  CO2 25  GLUCOSE 100*  BUN 13  CREATININE 0.63  CALCIUM 9.3   GFR: CrCl cannot be calculated (Unknown ideal weight.). Liver Function Tests: No results for input(s): AST, ALT, ALKPHOS, BILITOT, PROT, ALBUMIN in the last 168 hours. No results for input(s): LIPASE, AMYLASE in the last 168 hours. No results for input(s): AMMONIA in the last 168 hours. Coagulation  Profile: No results for input(s): INR, PROTIME in the last 168 hours. Cardiac Enzymes: No results for input(s): CKTOTAL, CKMB, CKMBINDEX, TROPONINI in the last 168 hours. BNP (last 3 results) No results for input(s): PROBNP in the last 8760 hours. HbA1C: No results for input(s): HGBA1C in the last 72 hours. CBG: No results for input(s): GLUCAP in the last 168 hours. Lipid Profile: No results for input(s): CHOL, HDL, LDLCALC, TRIG, CHOLHDL, LDLDIRECT in the last 72 hours. Thyroid Function Tests: No results for input(s): TSH, T4TOTAL, FREET4, T3FREE, THYROIDAB in the last 72 hours. Anemia Panel: No results for input(s): VITAMINB12, FOLATE, FERRITIN, TIBC, IRON, RETICCTPCT in the last 72 hours. Urine analysis:    Component Value Date/Time   COLORURINE YELLOW (A) 12/15/2020 1718   APPEARANCEUR CLEAR (A) 12/15/2020 1718   LABSPEC 1.010 12/15/2020 1718   PHURINE 6.0 12/15/2020 1718   GLUCOSEU NEGATIVE 12/15/2020 1718   HGBUR NEGATIVE 12/15/2020 1718   BILIRUBINUR NEGATIVE 12/15/2020 1718   KETONESUR NEGATIVE 12/15/2020 1718   PROTEINUR NEGATIVE 12/15/2020 1718   NITRITE NEGATIVE 12/15/2020 1718   LEUKOCYTESUR NEGATIVE 12/15/2020 1718    Radiological Exams on Admission: No results found.   EKG: Independently reviewed, with result as described above.    Assessment/Plan   Kendra Campbell is a 86 y.o. female with medical history significant for hyperlipidemia, generalized anxiety disorder, osteoarthritis, who is admitted to [facility] on 12/15/2020 with acute hypoosmolar hyponatremia after presenting from home to [facility] ED complaining of generalized weakness.   Principal Problem:   Acute hyponatremia Active Problems:   Hyperlipidemia   Generalized weakness   OA (osteoarthritis) of knee   GAD (generalized anxiety disorder)     #) Acute hypo-osmolar hyponatremia: Outpatient labs checked earlier today for evaluation of progressive generalized weakness  were found to be notable for serum sodium of 124, which is reportedly consistent with acute hyponatremia for this patient, although I have been unable to locate any prior serum sodium data points per my review of her chart, including review of Care Everywhere.  Repeat BMP upon presenting to [facility] ED today was found to be slightly higher at 127, without need for glycemic correction.  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  Hyponatremia concurrent with hypochloremia noted on presenting labs is also consistent with this delusional effect.   Additionally, suspect pharmacologic contribution in the setting of preceding increase in dose of home bupropion by factor of 3 in the days leading up to initial onset of generalized weakness.  Given that bupropion can be associated with hyponatremia, and that the patient's generalized weakness started a few days after a tripling of this dose, I do strongly suspect a contribution from this pharmacologic factor.  I have placed an inpatient pharmacy consult for assistance with accurate reconciliation of home medication list, to evaluate for the presence of any additional medications that could be contributing neurologically to her hyponatremia, including that of any other antidepressant medications, particularly knowing that the patient had previously been on Lexapro.  There may also be a more minor contribution from the patient's routine use of NSAIDs. not on HCTZ.  No recent trauma or head injury to  suspect cerebral salt wasting syndrome.  Denies any acute pulmonaptoms, although will check chest x-ray to evaluate for any acute cardiopulmonary findings that may be providing SIADH implications.  No recent GI losses.  Denies any routine alcohol consumption, thereby limiting likelihood of beer drinkers better mania.  Reducing likelihood of beer drinkers Potomania.  No evidence of overt volume overload, and no known history of liver disease or CHF.   Of note, the patient received a 1 L IV normal saline bolus in the ED this evening.  Will refrain from additional provision of IV fluids pending results of random urine sodium and urine osmolality studies provide additional insight into evaluation for SIADH, as subsequent management would differ depending upon these results. Will also check serum uric acid level as SIADH can be associated with hypouricemia due to hyperuricuria.  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  No evidence of acute focal neurologic deficits.    Plan: monitor strict I's and O's and daily weights.  check random urine sodium, urine osmolality, and serum uric acid level, as above. refrain from additional IVF's pending these results, as above. Check serum osmolality to confirm suspected hypoosmolar etiology.  Repeat BMP at 1 AM to monitor rate of correction of serum sodium following interval administration of 1 L bolus normal saline.  Repeat BMP in the morning.  Check TSH.  Check chest x-ray to evaluate for any contributory acute cardiopulmonary process.  Check INR to evaluate hepatic synthetic function.  Inpatient pharmacy consult placed for assistance with accurate reconciliation of medication list.  Follow for result of screening  nasopharyngeal COVID-19 PCR checked in the ED this evening.       #) Generalized weakness: the patient reports approximately 3 weeks of progressive generalized weakness in the absence of any evidence of acute focal neurologic deficits, including no evidence of acute focal weakness.  Consequently, acute ischemic CVA is felt to be less likely at this time.  Suspect contribution from acute hyponatremia, as further described above.  No current evidence of underlying infectious process, including presenting urinalysis that is inconsistent with urinary  tract infection.  Of note, result of screening nasopharyngeal COVID-19 PCR performed in the ED today is currently pending.    Plan: work-up and management of presenting acute hypoosmolar hyponatremia, as described above.  Check TSH and MMA.  Check chest x-ray and monitor for result of nasopharyngeal COVID-19 PCR.  Physical therapy consult placed for tomorrow morning.  Repeat BMP in the morning.       #) Hyperlipidemia: Reportedly on atorvastatin as an outpatient.  Plan: Continue home statin.     #) Generalized anxiety disorder: Reportedly on bupropion, with recent increase in outpatient dosing, as further described above.  Per chart review, it appears that the patient was also previously on Lexapro, although is currently unclear to me if she has continued to take this medication in tandem with escalating dose of bupropion.  She is also on as needed Xanax at home.  Plan: Hold home bupropion for now.  Inpatient pharmacy consult placed for assistance with bed.  Outpatient medication reconciliation, including that of Lexapro.  Hold home as needed Xanax for now to allow for a brief washout in the setting of presenting generalized weakness.      #) Osteoarthritis: The patient reports a history of osteoarthritis in the bilateral knees, for which she reports taking routine ibuprofen, which could potentially be contributing to her presenting acute  hyponatremia, as further outlined above.  Plan: Counseled the patient on the importance of conservative use of as needed ibuprofen at home, including from the standpoint of increased risk for gastritis versus peptic ulcer disease.     DVT prophylaxis: SCDs Code Status: Full code Family Communication: I discussed the patient's case with her daughter, who was present at bedside. Disposition Plan: Per Rounding Team Consults called: none  Admission status: Inpatient; med telemetry.    Of note, this patient was added by me to the following Admit List/Treatment Team: armcadmits.      PLEASE NOTE THAT DRAGON DICTATION SOFTWARE WAS USED IN THE CONSTRUCTION OF THIS NOTE.   [facility] Pager [phone] From 12PM - 12AM  Otherwise, please contact night-coverage  [email or web address] Password [facility]   12/15/2020, 9:53 PM

## 2020-12-15 NOTE — ED Notes (Signed)
CBC and CMP available for review under Care Everywhere.

## 2020-12-15 NOTE — ED Triage Notes (Signed)
First Nurse Note:  Arrives from [facility] for ED evaluation.  Per report, patient has been seen through clinic multiple times recently and patient has had a slightly elevated WBC and low sodium, with most recent value 124.  Provider is concerned that patient lives alone and appears to be slightly confused with lower sodium.

## 2020-12-15 NOTE — ED Provider Notes (Signed)
[facility] Emergency Department Provider Note ____________________________________________   Event Date/Time   First MD Initiated Contact with Patient 12/15/20 1926     (approximate)  I have reviewed the triage vital signs and the nursing notes.  HISTORY  Chief Complaint Abnormal Labs and Altered Mental Status   HPI Kendra Campbell is a 86 y.o. femalewho presents to the ED for evaluation of abnormal outpatient labs.   Chart review indicates patient was seen as an outpatient earlier this afternoon for confusion.  Blood work was drawn, returned later this afternoon with a sodium of 124. Multiple treatments for UTI recently with symptoms of urinary frequency.  Initially with Macrobid on 2/14, then switched to Cipro on 2/25.   Patient presents to the ED with her daughter for evaluation of subacute generalized weakness.  Daughter provides some additional history, but patient provides majority of history.  She lives at home alone and is independently ambulatory and functional in all of her own ADLs.  Patient reports being concerned about a UTI over the past 1 month.  She reports that she had symptoms of urinary frequency and generalized weakness.  She reports continued and worsening generalized weakness over subacute timeframe, particularly over the past 1-2 weeks.  She reports that her PCP urged her "to drink a lot of water" so she reports increased intake of free water "to try to flush it out."   She denies any fevers, dysuria, diarrhea, emesis, syncope, chest pain, palpitations or seizure activity.  Past Medical History:  Diagnosis Date  . Anemia    h/o  . Anxiety   . Arthritis    knees and back  . Bulging lumbar disc    PT DOES WATER AEROBICS AND SEES CHIROPRACTOR MONTHLY AND HAS NO PROBLEMS WITH BACK  . Factor V Leiden mutation ([place]) 10/31/2016   herterozygote  . Hyperlipidemia   . Inguinal hernia   . Phlebitis   . Pneumonia 1999    Patient  Active Problem List   Diagnosis Date Noted  . Allergy 03/12/2018  . Hernia 03/12/2018  . Heterozygous factor V Leiden mutation ([place]) 11/22/2016  . Right inguinal hernia 10/31/2016  . Anemia, unspecified 01/19/2014  . History of phlebitis 01/19/2014  . Hyperlipidemia 01/19/2014  . Osteoporosis 01/19/2014    Past Surgical History:  Procedure Laterality Date  . APPENDECTOMY  1957  . BREAST CYST ASPIRATION    . CATARACT EXTRACTION  2014  . COLONOSCOPY  2013  . COLONOSCOPY WITH PROPOFOL N/A 04/03/2017   Procedure: COLONOSCOPY WITH PROPOFOL;  Surgeon: Lollie Sails, MD;  Location: [facility] ENDOSCOPY;  Service: Endoscopy;  Laterality: N/A;  . EYE SURGERY Bilateral   . HERNIA REPAIR    . INGUINAL HERNIA REPAIR Right 12/02/2016   Medium Ultra Pro mesh.  Surgeon: Robert Bellow, MD;  Location: [facility] ORS;  Service: General;  Laterality: Right;    Prior to Admission medications   Medication Sig Start Date End Date Taking? Authorizing Provider  ALPRAZolam Duanne Moron) 0.25 MG tablet Take by mouth. 02/24/18   [provider]  atorvastatin (LIPITOR) 40 MG tablet TAKE 1 TABLET (40 MG TOTAL) BY MOUTH ONCE DAILY. 01/16/18   [provider]  Camphor-Menthol 0.2-3.5 % GEL Apply topically.    [provider]  carboxymethylcellulose (REFRESH PLUS) 0.5 % SOLN Place 1-2 drops into both eyes 2 (two) times daily.    [provider]  cholecalciferol (VITAMIN D) 1000 units tablet Take 1,000 Units by mouth daily.  [provider]  Coenzyme Q10 (CO Q-10) 200 MG CAPS Take 200 mg by mouth daily.    [provider]  Cyanocobalamin (VITAMIN B-12 PO) Place 1 tablet under the tongue daily.     [provider]  diphenhydrAMINE (BENADRYL) 25 MG tablet Take 25 mg by mouth daily.    [provider]  escitalopram (LEXAPRO) 20 MG tablet TAKE 1 TABLET BY MOUTH EVERY DAY 05/17/19   Ursula Alert, MD  ibuprofen (ADVIL,MOTRIN) 200 MG tablet Take 200 mg by  mouth every 8 (eight) hours as needed (for pain.).    [provider]  traZODone (DESYREL) 50 MG tablet TAKE 1/2 TO 1 TABLETS (25-50 MG TOTAL) BY MOUTH AT BEDTIME. 04/12/19   Ursula Alert, MD  vitamin C (ASCORBIC ACID) 500 MG tablet Take 500 mg by mouth as needed.    [provider]    Allergies Alendronate, Ampicillin, Codeine sulfate, and Penicillins  Family History  Problem Relation Age of Onset  . Osteoporosis Mother   . Stomach cancer Father   . Factor V Leiden deficiency Neg Hx        PE, long term anticoagulation.  . Breast cancer Neg Hx     Social History Social History   Tobacco Use  . Smoking status: Never Smoker  . Smokeless tobacco: Never Used  Vaping Use  . Vaping Use: Never used  Substance Use Topics  . Alcohol use: No  . Drug use: No    Review of Systems  Constitutional: No fever/chills.  Positive for generalized weakness. Eyes: No visual changes. ENT: No sore throat. Cardiovascular: Denies chest pain. Respiratory: Denies shortness of breath. Gastrointestinal: No abdominal pain.  No nausea, no vomiting.  No diarrhea.  No constipation. Genitourinary: Negative for dysuria. Musculoskeletal: Negative for back pain. Skin: Negative for rash. Neurological: Negative for headaches, focal weakness or numbness.  ____________________________________________   PHYSICAL EXAM:  VITAL SIGNS: Vitals:   12/15/20 1721 12/15/20 2004  BP: (!) 172/95 (!) 176/94  Pulse: 73 69  Resp: 16 18  Temp: 97.6 F (36.4 C)   SpO2: 99% 99%    Constitutional: Alert and oriented. Well appearing and in no acute distress.  Sitting up in bed, pleasant and conversational in full sentences. Eyes: Conjunctivae are normal. PERRL. EOMI. Head: Atraumatic. Nose: No congestion/rhinnorhea. Mouth/Throat: Mucous membranes are moist.  Oropharynx non-erythematous. Neck: No stridor. No cervical spine tenderness to palpation. Cardiovascular: Normal rate, regular rhythm.  Grossly normal heart sounds.  Good peripheral circulation. Respiratory: Normal respiratory effort.  No retractions. Lungs CTAB. Gastrointestinal: Soft , nondistended, nontender to palpation. No CVA tenderness.  Benign abdomen. Musculoskeletal: No lower extremity tenderness nor edema.  No joint effusions. No signs of acute trauma. Neurologic:  Normal speech and language. No gross focal neurologic deficits are appreciated.  Cranial nerves II through XII intact 5/5 strength and sensation in all 4 extremities Skin:  Skin is warm, dry and intact. No rash noted. Psychiatric: Mood and affect are normal. Speech and behavior are normal. ____________________________________________   LABS (all labs ordered are listed, but only abnormal results are displayed)  Labs Reviewed  SARS CORONAVIRUS 2 (TAT 6-24 HRS)  CBC WITH DIFFERENTIAL/PLATELET  BASIC METABOLIC PANEL  URINALYSIS, COMPLETE (UACMP) WITH MICROSCOPIC   ____________________________________________  12 Lead EKG  Sinus rhythm, rate of 70 bpm.  Normal axis.  Incomplete right bundle and otherwise normal intervals.  No evidence of acute ischemia.  ____________________________________________   PROCEDURES and INTERVENTIONS  Procedure(s) performed (including Critical Care):  Marland Kitchen  1-3 Lead EKG Interpretation Performed by: Vladimir Crofts, MD Authorized by: Vladimir Crofts, MD     Interpretation: normal     ECG rate:  70   ECG rate assessment: normal     Rhythm: sinus rhythm     Ectopy: none     Conduction: normal      Medications  sodium chloride 0.9 % bolus 1,000 mL (1,000 mLs Intravenous New Bag/Given 12/15/20 2003)    ____________________________________________   MDM / ED COURSE   86 year old woman presents to the ED with generalized weakness, with evidence of hyponatremia requiring medical observation admission.  Mild hypertension, otherwise normal vitals on room air.  Exam is reassuring without evidence of acute derangements.   She is alert and oriented, has no evidence of neurovascular deficits, distress or any signs of trauma.  Blood work from earlier this afternoon is reviewed demonstrating a sodium of 124 as well as hypochloremia, I am most concerned about excessive free water intake causing her hyponatremia and associated generalized weakness.  We will bolus her normal saline and discussed the case with hospitalist medicine for possible observation admission to ensure she corrects appropriately.     ____________________________________________   FINAL CLINICAL IMPRESSION(S) / ED DIAGNOSES  Final diagnoses:  Hyponatremia  Generalized weakness     ED Discharge Orders    None       Kendra Campbell   Note:  This document was prepared using Dragon voice recognition software and may include unintentional dictation errors.   Vladimir Crofts, MD 12/15/20 2031

## 2020-12-16 ENCOUNTER — Other Ambulatory Visit: Payer: Self-pay

## 2020-12-16 DIAGNOSIS — E871 Hypo-osmolality and hyponatremia: Secondary | ICD-10-CM | POA: Diagnosis not present

## 2020-12-16 DIAGNOSIS — E222 Syndrome of inappropriate secretion of antidiuretic hormone: Secondary | ICD-10-CM | POA: Diagnosis not present

## 2020-12-16 LAB — BASIC METABOLIC PANEL
Anion gap: 7 (ref 5–15)
BUN: 10 mg/dL (ref 8–23)
CO2: 25 mmol/L (ref 22–32)
Calcium: 8.7 mg/dL — ABNORMAL LOW (ref 8.9–10.3)
Chloride: 99 mmol/L (ref 98–111)
Creatinine, Ser: 0.6 mg/dL (ref 0.44–1.00)
GFR, Estimated: >60 mL/min (ref >60)
Glucose, Bld: 97 mg/dL (ref 70–99)
Potassium: 3.6 mmol/L (ref 3.5–5.1)
Sodium: 131 mmol/L — ABNORMAL LOW (ref 135–145)

## 2020-12-16 LAB — CBC
HCT: 36 % (ref 36.0–46.0)
Hemoglobin: 12.3 g/dL (ref 12.0–15.0)
MCH: 30.8 pg (ref 26.0–34.0)
MCHC: 34.2 g/dL (ref 30.0–36.0)
MCV: 90.2 fL (ref 80.0–100.0)
Platelets: 201 10*3/uL (ref 150–400)
RBC: 3.99 MIL/uL (ref 3.87–5.11)
RDW: 12.7 % (ref 11.5–15.5)
WBC: 6.3 10*3/uL (ref 4.0–10.5)
nRBC: 0 % (ref 0.0–0.2)

## 2020-12-16 LAB — COMPREHENSIVE METABOLIC PANEL
ALT: 17 U/L (ref 0–44)
AST: 18 U/L (ref 15–41)
Albumin: 3.7 g/dL (ref 3.5–5.0)
Alkaline Phosphatase: 44 U/L (ref 38–126)
Anion gap: 7 (ref 5–15)
BUN: 9 mg/dL (ref 8–23)
CO2: 24 mmol/L (ref 22–32)
Calcium: 8.7 mg/dL — ABNORMAL LOW (ref 8.9–10.3)
Chloride: 100 mmol/L (ref 98–111)
Creatinine, Ser: 0.62 mg/dL (ref 0.44–1.00)
GFR, Estimated: >60 mL/min (ref >60)
Glucose, Bld: 94 mg/dL (ref 70–99)
Potassium: 3.5 mmol/L (ref 3.5–5.1)
Sodium: 131 mmol/L — ABNORMAL LOW (ref 135–145)
Total Bilirubin: 0.9 mg/dL (ref 0.3–1.2)
Total Protein: 6.1 g/dL — ABNORMAL LOW (ref 6.5–8.1)

## 2020-12-16 LAB — SARS CORONAVIRUS 2 (TAT 6-24 HRS): SARS Coronavirus 2: NEGATIVE

## 2020-12-16 LAB — PROTIME-INR
INR: 1.1 (ref 0.8–1.2)
Prothrombin Time: 14 seconds (ref 11.4–15.2)

## 2020-12-16 LAB — SODIUM, URINE, RANDOM: Sodium, Ur: 49 mmol/L

## 2020-12-16 LAB — MAGNESIUM: Magnesium: 2 mg/dL (ref 1.7–2.4)

## 2020-12-16 NOTE — Evaluation (Signed)
Physical Therapy Evaluation Patient Details Name: Kendra Campbell MRN: [id number] DOB: 07/12/1935 Today's Date: 12/16/2020   History of Present Illness  86 yo female with onset of acute hyponatremia was admitted, has replenished Na+ and referred to PT.  PMHx:  HLD, anxiety, OA, bulging disc, inguinal hernia, phlebitis, PNA  Clinical Impression  Pt is up to walk with help and noted her independence was mitigated by HR jump that set alarm off on telemetry.  Her resting rate is 79, then was 123 with walk on the hallway, and standing rest brought to 86.  Pt is then walked to complete the trip with slower pace and was 85 at the end.  Follow acutely to monitor her tolerance for activity and HHPT to check on her ability to mobilize without excessive impact on her CV system at home.  Focus on her titration of gait speeds, increase distances without overtaxing her system and incorporate stairs at some point if time permits.    Follow Up Recommendations Home health PT;Supervision for mobility/OOB    Equipment Recommendations  None recommended by PT    Recommendations for Other Services       Precautions / Restrictions Precautions Precautions: Fall Precaution Comments: pt is not on an AD baseline Restrictions Weight Bearing Restrictions: No      Mobility  Bed Mobility Overal bed mobility: Needs Assistance Bed Mobility: Supine to Sit;Sit to Supine     Supine to sit: Min guard Sit to supine: Min guard   General bed mobility comments: using bedrail    Transfers Overall transfer level: Needs assistance Equipment used: 1 person hand held assist Transfers: Sit to/from Stand Sit to Stand: Min guard            Ambulation/Gait Ambulation/Gait assistance: Min guard Gait Distance (Feet): 300 Feet (two standing rests for HR) Assistive device: 1 person hand held assist Gait Pattern/deviations: Step-through pattern;Narrow base of support;Decreased stride length Gait velocity:  reduced Gait velocity interpretation: <1.31 ft/sec, indicative of household ambulator General Gait Details: pt walked on hallway with no AD and min guard for safety with two rests for alarm on telemetry for HR at 123.  Rests and drops to 85, then maintained with slow paced walk  Stairs            Wheelchair Mobility    Modified Rankin (Stroke Patients Only)       Balance Overall balance assessment: Needs assistance Sitting-balance support: Feet supported Sitting balance-Leahy Scale: Good     Standing balance support: No upper extremity supported Standing balance-Leahy Scale: Fair                               Pertinent Vitals/Pain Pain Assessment: No/denies pain    Home Living Family/patient expects to be discharged to:: Private residence Living Arrangements: Alone Available Help at Discharge: Family;Available PRN/intermittently Type of Home: House Home Access: Stairs to enter Entrance Stairs-Rails: None Entrance Stairs-Number of Steps: 3 Home Layout: One level Home Equipment: Shower seat Additional Comments: may have a cane but not clear she does    Prior Function Level of Independence: Independent         Comments: has been fully independent with house and self care     Hand Dominance   Dominant Hand: Right    Extremity/Trunk Assessment   Upper Extremity Assessment Upper Extremity Assessment: Overall WFL for tasks assessed    Lower Extremity Assessment Lower Extremity Assessment: Overall [facility]  for tasks assessed    Cervical / Trunk Assessment Cervical / Trunk Assessment: Other exceptions (has degenerative back changes)  Communication   Communication: No difficulties  Cognition Arousal/Alertness: Awake/alert Behavior During Therapy: WFL for tasks assessed/performed Overall Cognitive Status: Within Functional Limits for tasks assessed                                        General Comments General comments (skin  integrity, edema, etc.): pt was seen for mobility on no AD, and monitored HR on the hall.  Has tolerance set low at 123 alarming the telemetry, able to control with gait speed    Exercises     Assessment/Plan    PT Assessment Patient needs continued PT services  PT Problem List Decreased activity tolerance;Decreased mobility;Cardiopulmonary status limiting activity       PT Treatment Interventions DME instruction;Gait training;Stair training;Functional mobility training;Therapeutic activities;Therapeutic exercise;Balance training;Neuromuscular re-education;Patient/family education    PT Goals (Current goals can be found in the Care Plan section)  Acute Rehab PT Goals Patient Stated Goal: to get home and feel stronger PT Goal Formulation: With patient Time For Goal Achievement: 12/23/20 Potential to Achieve Goals: Good    Frequency Min 2X/week   Barriers to discharge Decreased caregiver support home independently    Co-evaluation               AM-PAC PT "6 Clicks" Mobility  Outcome Measure Help needed turning from your back to your side while in a flat bed without using bedrails?: None Help needed moving from lying on your back to sitting on the side of a flat bed without using bedrails?: None Help needed moving to and from a bed to a chair (including a wheelchair)?: A Little Help needed standing up from a chair using your arms (e.g., wheelchair or bedside chair)?: A Little Help needed to walk in hospital room?: A Little Help needed climbing 3-5 steps with a railing? : A Lot 6 Click Score: 19    End of Session   Activity Tolerance: Treatment limited secondary to medical complications (Comment) Patient left: in bed;with call bell/phone within reach;with bed alarm set;with family/visitor present Nurse Communication: Mobility status PT Visit Diagnosis: Difficulty in walking, not elsewhere classified (R26.2)    Time: 1443-1540 PT Time Calculation (min) (ACUTE ONLY):  31 min   Charges:   PT Evaluation $PT Eval Moderate Complexity: 1 Mod PT Treatments $Gait Training: 8-22 mins       Ramond Dial 12/16/2020, 12:48 PM  Mee Hives, PT MS Acute Rehab Dept. Number: [place]

## 2020-12-16 NOTE — Progress Notes (Signed)
  PROGRESS NOTE  NAVY ROTHSCHILD OJJ:[id number] DOB: 17-Aug-1935 DOA: 12/15/2020 PCP: [place] Pink, MD  Brief History   The patient is a 86 yr old woman who presented to [facility] ED on 12/15/2020 with complaints of confusion and weakness. She had been found to have a sodium of 124 at her PCP's office. It was 127 in the ED. DDx includes increased doses of bupropion lately, NSAIDS, and increased intake of water (although) it is unlikely that she was drinking enough to bring down her Na. Bupropion and NSAIDS have been held. This morning the patient's NA was up to 131. She states that she feels much better. PT/OT has been ordered.   Consultants  . None  Procedures  . None  Antibiotics   Anti-infectives (From admission, onward)   None    .  Subjective  The patient is resting comfortably. She states that she feels a lot better. She is awake, alert, and oriented x 3.  Objective   Vitals:  Vitals:   12/16/20 0759 12/16/20 1217  BP: 128/84 109/73  Pulse: 68 73  Resp: 16 16  Temp: 98.2 F (36.8 C) 98.5 F (36.9 C)  SpO2: 97% 98%    Exam:  Constitutional:  . The patient is awake, alert, and oriented x 3. No acute distress. Respiratory:  . No increased work of breathing. . No wheezes, rales, or rhonchi . No tactile fremitus Cardiovascular:  . Regular rate and rhythm . No murmurs, ectopy, or gallups. . No lateral PMI. No thrills. Abdomen:  . Abdomen is soft, non-tender, non-distended . No hernias, masses, or organomegaly . Normoactive bowel sounds.  Musculoskeletal:  . No cyanosis, clubbing, or edema Skin:  . No rashes, lesions, ulcers . palpation of skin: no induration or nodules Neurologic:  . CN 2-12 intact . Sensation all 4 extremities intact Psychiatric:  . Mental status o Mood, affect appropriate o Orientation to person, place, time  . judgment and insight appear intact  I have personally reviewed the following:   Today's Data  . Vitals, CBC, CMP  Imaging   . CXR  Scheduled Meds: . atorvastatin  40 mg Oral QHS   Principal Problem:   Acute hyponatremia Active Problems:   Hyperlipidemia   Generalized weakness   OA (osteoarthritis) of knee   GAD (generalized anxiety disorder)   LOS: 1 day   A & P  Acute hypo-osmolar hyponatremia: DDx: SIADH due to increased dose of Bupropion, Increased use of NSAIDS, potomania (doubtful).   Generalized weakness: Improved likely due to hyponatremia. PT/OT is available.   GAD: Noted. Bupropion is being held until the patient can follow up with PCP.  Hyperlipidemia:   [name], DO [facility] Direct contact: see [email or web address]  7PM-7AM contact night coverage as above 12/16/2020, 3:13 PM  LOS: 1 day

## 2020-12-17 DIAGNOSIS — E222 Syndrome of inappropriate secretion of antidiuretic hormone: Secondary | ICD-10-CM | POA: Diagnosis not present

## 2020-12-17 DIAGNOSIS — E871 Hypo-osmolality and hyponatremia: Secondary | ICD-10-CM | POA: Diagnosis not present

## 2020-12-17 LAB — BASIC METABOLIC PANEL
Anion gap: 9 (ref 5–15)
BUN: 13 mg/dL (ref 8–23)
CO2: 23 mmol/L (ref 22–32)
Calcium: 9 mg/dL (ref 8.9–10.3)
Chloride: 100 mmol/L (ref 98–111)
Creatinine, Ser: 0.75 mg/dL (ref 0.44–1.00)
GFR, Estimated: >60 mL/min (ref >60)
Glucose, Bld: 96 mg/dL (ref 70–99)
Potassium: 4 mmol/L (ref 3.5–5.1)
Sodium: 132 mmol/L — ABNORMAL LOW (ref 135–145)

## 2020-12-17 LAB — CBC WITH DIFFERENTIAL/PLATELET
Abs Immature Granulocytes: 0.03 10*3/uL (ref 0.00–0.07)
Basophils Absolute: 0.1 10*3/uL (ref 0.0–0.1)
Basophils Relative: 1 %
Eosinophils Absolute: 0.3 10*3/uL (ref 0.0–0.5)
Eosinophils Relative: 4 %
HCT: 35.6 % — ABNORMAL LOW (ref 36.0–46.0)
Hemoglobin: 12.3 g/dL (ref 12.0–15.0)
Immature Granulocytes: 1 %
Lymphocytes Relative: 34 %
Lymphs Abs: 2.2 10*3/uL (ref 0.7–4.0)
MCH: 30.8 pg (ref 26.0–34.0)
MCHC: 34.6 g/dL (ref 30.0–36.0)
MCV: 89 fL (ref 80.0–100.0)
Monocytes Absolute: 0.9 10*3/uL (ref 0.1–1.0)
Monocytes Relative: 14 %
Neutro Abs: 2.9 10*3/uL (ref 1.7–7.7)
Neutrophils Relative %: 46 %
Platelets: 199 10*3/uL (ref 150–400)
RBC: 4 MIL/uL (ref 3.87–5.11)
RDW: 13 % (ref 11.5–15.5)
WBC: 6.4 10*3/uL (ref 4.0–10.5)
nRBC: 0 % (ref 0.0–0.2)

## 2020-12-17 NOTE — TOC Transition Note (Signed)
Transition of Care [facility]) - CM/SW Discharge Note   Patient Details  Name: Kendra Campbell MRN: [id number] Date of Birth: 1935/05/30  Transition of Care [facility]) CM/SW Contact:  Boris Sharper, LCSW Phone Number: 12/17/2020, 11:38 AM   Clinical Narrative:    Pt medically stable for discharge per MD. PT will be transported home by her daughter. CSW arranged [facility] with [facility]. [facility] for [facility] PT and OT, No DME needs at this time.   Final next level of care: [place] Barriers to Discharge: No Barriers Identified   Patient Goals and CMS Choice        Discharge Placement                  Name of family member notified: Pamala Hurry Patient and family notified of of transfer: 12/17/20  Discharge Plan and Services                          [place]: PT,OT [place]: [place] ([place]) Date [place]: 12/17/20 Time [place]: 1137 Representative spoke with at [place]: [place] (SDOH) Interventions     Readmission Risk Interventions No flowsheet data found.

## 2020-12-17 NOTE — Plan of Care (Signed)

## 2020-12-17 NOTE — Discharge Summary (Signed)
Physician Discharge Summary  Kendra Campbell YOV:[id number] DOB: [birth year] DOA: 12/15/2020  PCP: [place] Pink, MD  Admit date: 12/15/2020 Discharge date: 12/17/2020  Recommendations for Outpatient Follow-up:  1. Discharge to home with daughter with home health PT/OT 2. Follow up with PCP in 7-10 days. Have chemistry checked on that visit.  Discharge Diagnoses: Principal diagnosis is #1 1. 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 Generalized weakness 4. CAD 5. Generallized anxiety 6. Depression 7. Osteoarthritis  Discharge Condition: Fair  Disposition: Home with daughter  Diet recommendation: Heart healthy  Filed Weights   12/15/20 1721 12/16/20 0059  Weight: 61.7 kg 60.6 kg    History of present illness:  Kendra Campbell is a 86 y.o. female with medical history significant for hyperlipidemia, generalized anxiety disorder, osteoarthritis, who is admitted to [facility] on 12/15/2020 with acute hypoosmolar hyponatremia after presenting from home to [facility] ED complaining of generalized weakness.   The following history was obtained via my discussions with the patient as well as my discussions with the patient's daughter, who was present at bedside, in addition to my discussions with the emergency department physician and via chart review.  The patient reports approximately 3 weeks of progressive generalized weakness in the absence of any associated acute focal weakness, acute focal numbness, paresthesias, dysarthria, facial droop, acute change in vision, vertigo, dysphagia.  At baseline, the patient lives by herself and is able to independently perform all of her own ADLs, and is quite active, including going to the gym multiple times per week at baseline functional status.  Consistent with this, she reports that at baseline, she is able to ambulate and transfer without any assistance.  However, over the course of progressive generalized weakness  over the last 3 weeks, patient reports progressive difficulty in performing her ADLs, and notes that she has not had the energy to go to the gym over that timeframe.  Overall, she reports this recent onset of progressive generalized weakness as significantly impacted her ability to function independently, and has affected the quality of her life including her pursuit of recreational activities, as above.   Early in February 2022, the patient reports that she had experienced a few days of dysuria, prompting PCP to check urinalysis with reported ensuing diagnosis of urinary tract infection.  At that time, the patient was started on a course of Macrobid.  At the conclusion of her course of Macrobid, which finished on 11/27/2020, the patient reports interval resolution of her dysuria, but noted no significant improvement in her generalized weakness, prompting suspicion from outpatient provider regarding presence of residual UTI.  Consequently, the patient was started on ciprofloxacin, completing a course of this antibiotic on 12/08/2020, following which patient denies any significant improvement in her generalized weakness.  She denies any recent subjective fever, chills, rigors, or generalized myalgias.  Denies any recent nausea, vomiting, abdominal pain, diarrhea.  She also denies any recent trauma, including no recent falls.  Denies any recent chest pain, palpitations, diaphoresis, shortness of breath, dizziness, presyncope, or syncope.  Starting approximately 3 weeks ago, the patient reports that she was instructed by her PCP to significantly increase her volume of consumption of free water in order to induce polyuria and effort to clear any underlying urinary tract infection.  The patient reports that she was very compliant with this recommendation, increasing her daily volume of free water consumption by 2-3 times at a baseline over the ensuing 2 to 3 weeks.  Denies any ensuing  shortness of breath, orthopnea,  PND, or peripheral edema.  She also denies any recent rhinitis, rhinorrhea, cough.    Approximately 3 to 4 weeks ago, the patient and her daughter report that the patient's bupropion was increased from the existing dose of 100 mg p.o. daily to 3 times this previous dose, with ensuing instructions from PCP to take bupropion 150 mg p.o. twice daily.  Patient reports noting onset of generalized weakness around the time of this dose adjustment.  She reports typical use of 200 to 400 mg of ibuprofen on a daily basis, but notes that she has been taking this NSAID at this dose and frequency for quite some time, adjustment.  She denies any use of thiazide diuretics, but is unsure if she continues to take the Lexapro that was on her previous outpatient medication reconciliation.  Additional notable home medications, including that of trazodone for which the patient takes on a nightly basis for insomnia.  Denies any known history of underlying hypothyroidism or adrenal insufficiency.  Denies any known history of underlying liver disease or congestive heart failure.   Denies any recent headache, neck stiffness, sore throat, sob, wheezing, or rash. No recent traveling or known COVID-19 exposures.   In the context of progressive generalized weakness, the patient's PCP ordered a BMP as an outpatient, which was checked earlier today, and demonstrated a sodium result of 124.  Subsequently contacted the patient with this result, which is reportedly consistent with acute hyponatremia, and consequently instructed the patient to present to the local emergency department for further evaluation of acute hyponatremia in the setting of progressive generalized weakness.  Hospital Course:  The patient was admitted to a telemetry bed. She was treated conservatively. She received 1 L NS in the ED, but no more IV fluids. Her NSAIDS and Bupropion were held and her Sodium was carefully monitored. It improved from 124 prior to  admission to 127 in the ED to 131 the day after admission and 132 today.  The patient was evaluted by PT/OT. They have recommended Home health PT/OT and supervision for mobility.  Today's assessment: S: The patient is resting comfortably. No new complaints. O: Vitals:  Vitals:   12/17/20 0740 12/17/20 1132  BP: 140/80 (!) 141/86  Pulse: 64 77  Resp: 16 16  Temp: 98 F (36.7 C) 97.9 F (36.6 C)  SpO2: 98% 94%   Exam:  Constitutional:  . The patient is awake, alert, and oriented x 3. No acute distress. Respiratory:  . No increased work of breathing. . No wheezes, rales, or rhonchi . No tactile fremitus Cardiovascular:  . Regular rate and rhythm . No murmurs, ectopy, or gallups. . No lateral PMI. No thrills. Abdomen:  . Abdomen is soft, non-tender, non-distended . No hernias, masses, or organomegaly . Normoactive bowel sounds.  Musculoskeletal:  . No cyanosis, clubbing, or edema Skin:  . No rashes, lesions, ulcers . palpation of skin: no induration or nodules Neurologic:  . CN 2-12 intact . Sensation all 4 extremities intact Psychiatric:  . Mental status o Mood, affect appropriate o Orientation to person, place, time  . judgment and insight appear intact  Discharge Instructions  Discharge Instructions    Activity as tolerated - No restrictions   Complete by: As directed    Call MD for:  persistant nausea and vomiting   Complete by: As directed    Call MD for:  severe uncontrolled pain   Complete by: As directed    Diet - low sodium  heart healthy   Complete by: As directed    Discharge instructions   Complete by: As directed    Discharge to home with daughter with home health PT/OT Follow up with PCP in 7-10 days. Have chemistry checked on that visit.   Increase activity slowly   Complete by: As directed      Allergies as of 12/17/2020      Reactions   Alendronate Nausea And Vomiting   Ampicillin Nausea And Vomiting   Has patient had a PCN reaction  causing immediate rash, facial/tongue/throat swelling, SOB or lightheadedness with hypotension:No Has patient had a PCN reaction causing severe rash involving mucus membranes or skin necrosis:No Has patient had a PCN reaction that required hospitalization:No Has patient had a PCN reaction occurring within the last 10 years:No If all of the above answers are "NO", then may proceed with Cephalosporin use.   Codeine Sulfate Nausea And Vomiting   Penicillins Nausea And Vomiting   Has patient had a PCN reaction causing immediate rash, facial/tongue/throat swelling, SOB or lightheadedness with hypotension:Yes Has patient had a PCN reaction causing severe rash involving mucus membranes or skin necrosis:No Has patient had a PCN reaction that required hospitalization:No Has patient had a PCN reaction occurring within the last 10 years:No If all of the above answers are "NO", then may proceed with Cephalosporin use.      Medication List    STOP taking these medications   buPROPion 150 MG 12 hr tablet Commonly known as: WELLBUTRIN SR   Camphor-Menthol 0.2-3.5 % Gel   carboxymethylcellulose 0.5 % Soln Commonly known as: REFRESH PLUS   cholecalciferol 1000 units tablet Commonly known as: VITAMIN D   ciprofloxacin 500 MG tablet Commonly known as: CIPRO   Co Q-10 200 MG Caps   diphenhydrAMINE 25 MG tablet Commonly known as: BENADRYL   escitalopram 20 MG tablet Commonly known as: LEXAPRO   ibuprofen 200 MG tablet Commonly known as: ADVIL   VITAMIN B-12 PO   vitamin C 500 MG tablet Commonly known as: ASCORBIC ACID     TAKE these medications   ALPRAZolam 0.25 MG tablet Commonly known as: XANAX Take by mouth.   atorvastatin 40 MG tablet Commonly known as: LIPITOR TAKE 1 TABLET (40 MG TOTAL) BY MOUTH ONCE DAILY.   dorzolamide-timolol 22.3-6.8 MG/ML ophthalmic solution Commonly known as: COSOPT Place 1 drop into the right eye 2 (two) times daily.   traZODone 50 MG  tablet Commonly known as: DESYREL TAKE 1/2 TO 1 TABLETS (25-50 MG TOTAL) BY MOUTH AT BEDTIME.      Allergies  Allergen Reactions  . Alendronate Nausea And Vomiting  . Ampicillin Nausea And Vomiting    Has patient had a PCN reaction causing immediate rash, facial/tongue/throat swelling, SOB or lightheadedness with hypotension:No Has patient had a PCN reaction causing severe rash involving mucus membranes or skin necrosis:No Has patient had a PCN reaction that required hospitalization:No Has patient had a PCN reaction occurring within the last 10 years:No If all of the above answers are "NO", then may proceed with Cephalosporin use.   . Codeine Sulfate Nausea And Vomiting  . Penicillins Nausea And Vomiting    Has patient had a PCN reaction causing immediate rash, facial/tongue/throat swelling, SOB or lightheadedness with hypotension:Yes Has patient had a PCN reaction causing severe rash involving mucus membranes or skin necrosis:No Has patient had a PCN reaction that required hospitalization:No Has patient had a PCN reaction occurring within the last 10 years:No If all of the above  answers are "NO", then may proceed with Cephalosporin use.    The results of significant diagnostics from this hospitalization (including imaging, microbiology, ancillary and laboratory) are listed below for reference.    Significant Diagnostic Studies: DG Chest Port 1 View  Result Date: 12/15/2020 CLINICAL DATA:  Weakness EXAM: PORTABLE CHEST 1 VIEW COMPARISON:  None. FINDINGS: The heart size and mediastinal contours are within normal limits. Both lungs are clear. The visualized skeletal structures are unremarkable. IMPRESSION: No active disease. Electronically Signed   By: Randa Ngo M.D.   On: 12/15/2020 22:10    Microbiology: Recent Results (from the past 240 hour(s))  SARS CORONAVIRUS 2 (TAT 6-24 HRS) Nasopharyngeal Nasopharyngeal Swab     Status: None   Collection Time: 12/15/20  5:18 PM    Specimen: Nasopharyngeal Swab  Result Value Ref Range Status   SARS Coronavirus 2 NEGATIVE NEGATIVE Final    Comment: (NOTE) SARS-CoV-2 target nucleic acids are NOT DETECTED.  The SARS-CoV-2 RNA is generally detectable in upper and lower respiratory specimens during the acute phase of infection. Negative results do not preclude SARS-CoV-2 infection, do not rule out co-infections with other pathogens, and should not be used as the sole basis for treatment or other patient management decisions. Negative results must be combined with clinical observations, patient history, and epidemiological information. The expected result is Negative.  Fact Sheet for Patients: SugarRoll.be  Fact Sheet for Healthcare Providers: https://www.woods-mathews.com/  This test is not yet approved or cleared by the [place] FDA and  has been authorized for detection and/or diagnosis of SARS-CoV-2 by FDA under an Emergency Use Authorization (EUA). This EUA will remain  in effect (meaning this test can be used) for the duration of the COVID-19 declaration under Se ction 564(b)(1) of the Act, 21 U.S.C. section 360bbb-3(b)(1), unless the authorization is terminated or revoked sooner.  Performed at [facility], [place] [address], [place], [place] [address]      Labs: Basic Metabolic Panel: Recent Labs  Lab 12/15/20 1931 12/16/20 0049 12/16/20 0436 12/17/20 0449  NA 127* 131* 131* 132*  K 3.7 3.6 3.5 4.0  CL 93* 99 100 100  CO2 25 25 24 23   GLUCOSE 100* 97 94 96  BUN 13 10 9 13   CREATININE 0.63 0.60 0.62 0.75  CALCIUM 9.3 8.7* 8.7* 9.0  MG 2.1  --  2.0  --    Liver Function Tests: Recent Labs  Lab 12/16/20 0436  AST 18  ALT 17  ALKPHOS 44  BILITOT 0.9  PROT 6.1*  ALBUMIN 3.7   No results for input(s): LIPASE, AMYLASE in the last 168 hours. No results for input(s): AMMONIA in the last 168 hours. CBC: Recent Labs  Lab 12/15/20 1931  12/16/20 0436 12/17/20 0449  WBC 8.9 6.3 6.4  NEUTROABS 4.9  --  2.9  HGB 13.0 12.3 12.3  HCT 38.2 36.0 35.6*  MCV 89.5 90.2 89.0  PLT 209 201 199   Cardiac Enzymes: No results for input(s): CKTOTAL, CKMB, CKMBINDEX, TROPONINI in the last 168 hours. BNP: BNP (last 3 results) No results for input(s): BNP in the last 8760 hours.  ProBNP (last 3 results) No results for input(s): PROBNP in the last 8760 hours.  CBG: No results for input(s): GLUCAP in the last 168 hours.  Principal Problem:   Acute hyponatremia Active Problems:   Hyperlipidemia   Generalized weakness   OA (osteoarthritis) of knee   GAD (generalized anxiety disorder)   Time coordinating discharge: 38 minutes.  Signed:        Harolyn Cocker, DO [facility]  12/17/2020, 5:00 PM

## 2020-12-21 LAB — METHYLMALONIC ACID, SERUM: Methylmalonic Acid, Quantitative: 92 nmol/L (ref 0–378)

## 2021-04-05 ENCOUNTER — Other Ambulatory Visit: Payer: Self-pay | Admitting: Family Medicine

## 2021-04-05 DIAGNOSIS — Z1231 Encounter for screening mammogram for malignant neoplasm of breast: Secondary | ICD-10-CM

## 2021-05-18 ENCOUNTER — Ambulatory Visit
Admission: RE | Admit: 2021-05-18 | Discharge: 2021-05-18 | Disposition: A | Discharge status: Home / Self Care | Payer: Medicare Other | Source: Ambulatory Visit | Attending: Family Medicine | Admitting: Family Medicine

## 2021-05-18 ENCOUNTER — Other Ambulatory Visit: Payer: Self-pay

## 2021-05-18 DIAGNOSIS — Z1231 Encounter for screening mammogram for malignant neoplasm of breast: Secondary | ICD-10-CM | POA: Insufficient documentation

## 2022-04-23 ENCOUNTER — Other Ambulatory Visit: Payer: Self-pay | Admitting: Family Medicine

## 2022-04-23 DIAGNOSIS — Z1231 Encounter for screening mammogram for malignant neoplasm of breast: Secondary | ICD-10-CM

## 2022-05-21 ENCOUNTER — Ambulatory Visit
Admission: RE | Admit: 2022-05-21 | Discharge: 2022-05-21 | Disposition: A | Discharge status: Home / Self Care | Payer: Medicare Other | Source: Ambulatory Visit | Attending: Family Medicine | Admitting: Family Medicine

## 2022-05-21 DIAGNOSIS — Z1231 Encounter for screening mammogram for malignant neoplasm of breast: Secondary | ICD-10-CM | POA: Diagnosis present

## 2022-07-10 ENCOUNTER — Ambulatory Visit: Payer: Medicare Other | Admitting: Dermatology

## 2022-07-10 ENCOUNTER — Encounter: Payer: Self-pay | Admitting: Dermatology

## 2022-07-10 DIAGNOSIS — L570 Actinic keratosis: Secondary | ICD-10-CM

## 2022-07-10 DIAGNOSIS — L821 Other seborrheic keratosis: Secondary | ICD-10-CM | POA: Diagnosis not present

## 2022-07-10 DIAGNOSIS — D1801 Hemangioma of skin and subcutaneous tissue: Secondary | ICD-10-CM | POA: Diagnosis not present

## 2022-07-10 DIAGNOSIS — Z1283 Encounter for screening for malignant neoplasm of skin: Secondary | ICD-10-CM

## 2022-07-10 DIAGNOSIS — L820 Inflamed seborrheic keratosis: Secondary | ICD-10-CM

## 2022-07-10 DIAGNOSIS — L814 Other melanin hyperpigmentation: Secondary | ICD-10-CM

## 2022-07-10 DIAGNOSIS — L578 Other skin changes due to chronic exposure to nonionizing radiation: Secondary | ICD-10-CM | POA: Diagnosis not present

## 2022-07-10 NOTE — Progress Notes (Signed)
New Patient Visit  Subjective  Kendra Campbell is a 87 y.o. female who presents for the following: Skin Problem (Check a black growth on the left back that will not go away). The patient has spots, moles and lesions to be evaluated, some may be new or changing and the patient has concerns that these could be cancer.  The following portions of the chart were reviewed this encounter and updated as appropriate:   Tobacco  Allergies  Meds  Problems  Med Hx  Surg Hx  Fam Hx     Review of Systems:  No other skin or systemic complaints except as noted in HPI or Assessment and Plan.  Objective  Well appearing patient in no apparent distress; mood and affect are within normal limits.  A focused examination was performed including back,arms,face. Relevant physical exam findings are noted in the Assessment and Plan.  left mid back x 1 Stuck-on, waxy, tan-brown papule-- Discussed benign etiology and prognosis.   right temple x 1 Erythematous thin papules/macules with gritty scale.    Assessment & Plan  Inflamed seborrheic keratosis left mid back x 1  Symptomatic, irritating, patient would like treated.   Destruction of lesion - left mid back x 1 Complexity: simple   Destruction method: cryotherapy   Informed consent: discussed and consent obtained   Timeout:  patient name, date of birth, surgical site, and procedure verified Lesion destroyed using liquid nitrogen: Yes   Region frozen until ice ball extended beyond lesion: Yes   Outcome: patient tolerated procedure well with no complications   Post-procedure details: wound care instructions given    AK (actinic keratosis) right temple x 1  Actinic keratoses are precancerous spots that appear secondary to cumulative UV radiation exposure/sun exposure over time. They are chronic with expected duration over 1 year. A portion of actinic keratoses will progress to squamous cell carcinoma of the skin. It is not possible to  reliably predict which spots will progress to skin cancer and so treatment is recommended to prevent development of skin cancer.  Recommend daily broad spectrum sunscreen SPF 30+ to sun-exposed areas, reapply every 2 hours as needed.  Recommend staying in the shade or wearing long sleeves, sun glasses (UVA+UVB protection) and wide brim hats (4-inch brim around the entire circumference of the hat). Call for new or changing lesions.   Destruction of lesion - right temple x 1 Complexity: simple   Destruction method: cryotherapy   Informed consent: discussed and consent obtained   Timeout:  patient name, date of birth, surgical site, and procedure verified Lesion destroyed using liquid nitrogen: Yes   Region frozen until ice ball extended beyond lesion: Yes   Outcome: patient tolerated procedure well with no complications   Post-procedure details: wound care instructions given    Seborrheic Keratoses - Stuck-on, waxy, tan-brown papules and/or plaques  - Benign-appearing - Discussed benign etiology and prognosis. - Observe - Call for any changes  Hemangiomas - Red papules - Discussed benign nature - Observe - Call for any changes   Lentigines - Scattered tan macules - Due to sun exposure - Benign-appearing, observe - Recommend daily broad spectrum sunscreen SPF 30+ to sun-exposed areas, reapply every 2 hours as needed. - Call for any changes    Actinic Damage - chronic, secondary to cumulative UV radiation exposure/sun exposure over time - diffuse scaly erythematous macules with underlying dyspigmentation - Recommend daily broad spectrum sunscreen SPF 30+ to sun-exposed areas, reapply every 2 hours as needed.  -  Recommend staying in the shade or wearing long sleeves, sun glasses (UVA+UVB protection) and wide brim hats (4-inch brim around the entire circumference of the hat). - Call for new or changing lesions.  Return if symptoms worsen or fail to improve.  IMarye Round,  CMA, am acting as scribe for Sarina Ser, MD .  Documentation: I have reviewed the above documentation for accuracy and completeness, and I agree with the above.  Sarina Ser, MD

## 2022-07-10 NOTE — Patient Instructions (Addendum)
Cryotherapy Aftercare  Wash gently with soap and water everyday.   Apply Vaseline and Band-Aid daily until healed.     Due to recent changes in healthcare laws, you may see results of your pathology and/or laboratory studies on MyChart before the doctors have had a chance to review them. We understand that in some cases there may be results that are confusing or concerning to you. Please understand that not all results are received at the same time and often the doctors may need to interpret multiple results in order to provide you with the best plan of care or course of treatment. Therefore, we ask that you please give us 2 business days to thoroughly review all your results before contacting the office for clarification. Should we see a critical lab result, you will be contacted sooner.   If You Need Anything After Your Visit  If you have any questions or concerns for your doctor, please call our main line at [phone] and press option 4 to reach your doctor's medical assistant. If no one answers, please leave a voicemail as directed and we will return your call as soon as possible. Messages left after 4 pm will be answered the following business day.   You may also send us a message via MyChart. We typically respond to MyChart messages within 1-2 business days.  For prescription refills, please ask your pharmacy to contact our office. Our fax number is [phone].  If you have an urgent issue when the clinic is closed that cannot wait until the next business day, you can page your doctor at the number below.    Please note that while we do our best to be available for urgent issues outside of office hours, we are not available 24/7.   If you have an urgent issue and are unable to reach us, you may choose to seek medical care at your doctor's office, retail clinic, urgent care center, or emergency room.  If you have a medical emergency, please immediately call 911 or go to the  emergency department.  Pager Numbers  - Dr. Kowalski: [phone]  - Dr. Moye: [phone]  - Dr. Stewart: [phone]  In the event of inclement weather, please call our main line at [phone] for an update on the status of any delays or closures.  Dermatology Medication Tips: Please keep the boxes that topical medications come in in order to help keep track of the instructions about where and how to use these. Pharmacies typically print the medication instructions only on the boxes and not directly on the medication tubes.   If your medication is too expensive, please contact our office at [phone] option 4 or send us a message through MyChart.   We are unable to tell what your co-pay for medications will be in advance as this is different depending on your insurance coverage. However, we may be able to find a substitute medication at lower cost or fill out paperwork to get insurance to cover a needed medication.   If a prior authorization is required to get your medication covered by your insurance company, please allow us 1-2 business days to complete this process.  Drug prices often vary depending on where the prescription is filled and some pharmacies may offer cheaper prices.  The website www.goodrx.com contains coupons for medications through different pharmacies. The prices here do not account for what the cost may be with help from insurance (it may be cheaper with your insurance), but the website can   give you the price if you did not use any insurance.  - You can print the associated coupon and take it with your prescription to the pharmacy.  - You may also stop by our office during regular business hours and pick up a GoodRx coupon card.  - If you need your prescription sent electronically to a different pharmacy, notify our office through [facility] MyChart or by phone at [phone] option 4.     Si Usted Necesita Algo Despus de Su Visita  Tambin puede  enviarnos un mensaje a travs de MyChart. Por lo general respondemos a los mensajes de MyChart en el transcurso de 1 a 2 das hbiles.  Para renovar recetas, por favor pida a su farmacia que se ponga en contacto con nuestra oficina. Nuestro nmero de fax es el [phone].  Si tiene un asunto urgente cuando la clnica est cerrada y que no puede esperar hasta el siguiente da hbil, puede llamar/localizar a su doctor(a) al nmero que aparece a continuacin.   Por favor, tenga en cuenta que aunque hacemos todo lo posible para estar disponibles para asuntos urgentes fuera del horario de oficina, no estamos disponibles las 24 horas del da, los 7 das de la semana.   Si tiene un problema urgente y no puede comunicarse con nosotros, puede optar por buscar atencin mdica  en el consultorio de su doctor(a), en una clnica privada, en un centro de atencin urgente o en una sala de emergencias.  Si tiene una emergencia mdica, por favor llame inmediatamente al 911 o vaya a la sala de emergencias.  Nmeros de bper  - Dr. Kowalski: [phone]  - Dra. Moye: [phone]  - Dra. Stewart: [phone]  En caso de inclemencias del tiempo, por favor llame a nuestra lnea principal al [phone] para una actualizacin sobre el estado de cualquier retraso o cierre.  Consejos para la medicacin en dermatologa: Por favor, guarde las cajas en las que vienen los medicamentos de uso tpico para ayudarle a seguir las instrucciones sobre dnde y cmo usarlos. Las farmacias generalmente imprimen las instrucciones del medicamento slo en las cajas y no directamente en los tubos del medicamento.   Si su medicamento es muy caro, por favor, pngase en contacto con nuestra oficina llamando al [phone] y presione la opcin 4 o envenos un mensaje a travs de MyChart.   No podemos decirle cul ser su copago por los medicamentos por adelantado ya que esto es diferente dependiendo de la cobertura de su seguro.  Sin embargo, es posible que podamos encontrar un medicamento sustituto a menor costo o llenar un formulario para que el seguro cubra el medicamento que se considera necesario.   Si se requiere una autorizacin previa para que su compaa de seguros cubra su medicamento, por favor permtanos de 1 a 2 das hbiles para completar este proceso.  Los precios de los medicamentos varan con frecuencia dependiendo del lugar de dnde se surte la receta y alguna farmacias pueden ofrecer precios ms baratos.  El sitio web www.goodrx.com tiene cupones para medicamentos de diferentes farmacias. Los precios aqu no tienen en cuenta lo que podra costar con la ayuda del seguro (puede ser ms barato con su seguro), pero el sitio web puede darle el precio si no utiliz ningn seguro.  - Puede imprimir el cupn correspondiente y llevarlo con su receta a la farmacia.  - Tambin puede pasar por nuestra oficina durante el horario de atencin regular y recoger una tarjeta de cupones de GoodRx.  -   Si necesita que su receta se enve electrnicamente a una farmacia diferente, informe a nuestra oficina a travs de MyChart de [facility] o por telfono llamando al [phone] y presione la opcin 4.  

## 2023-01-02 IMAGING — MG MM DIGITAL SCREENING BILAT W/ TOMO AND CAD
8 series · 8 of 24 positions shown · non-contrast
Comparison: Previous exam(s).

CLINICAL DATA: Screening.

EXAM:
DIGITAL SCREENING BILATERAL MAMMOGRAM WITH TOMOSYNTHESIS AND CAD
TECHNIQUE: Bilateral screening digital craniocaudal and mediolateral oblique
mammograms were obtained. Bilateral screening digital breast
tomosynthesis was performed. The images were evaluated with
computer-aided detection.

[R CC synth-2D]
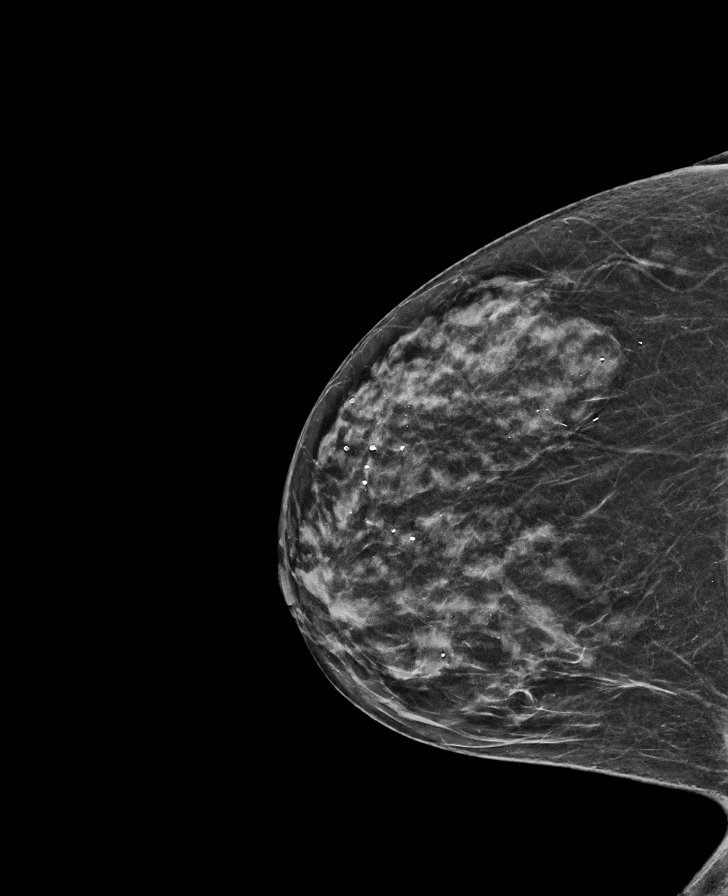

[L MLO synth-2D]
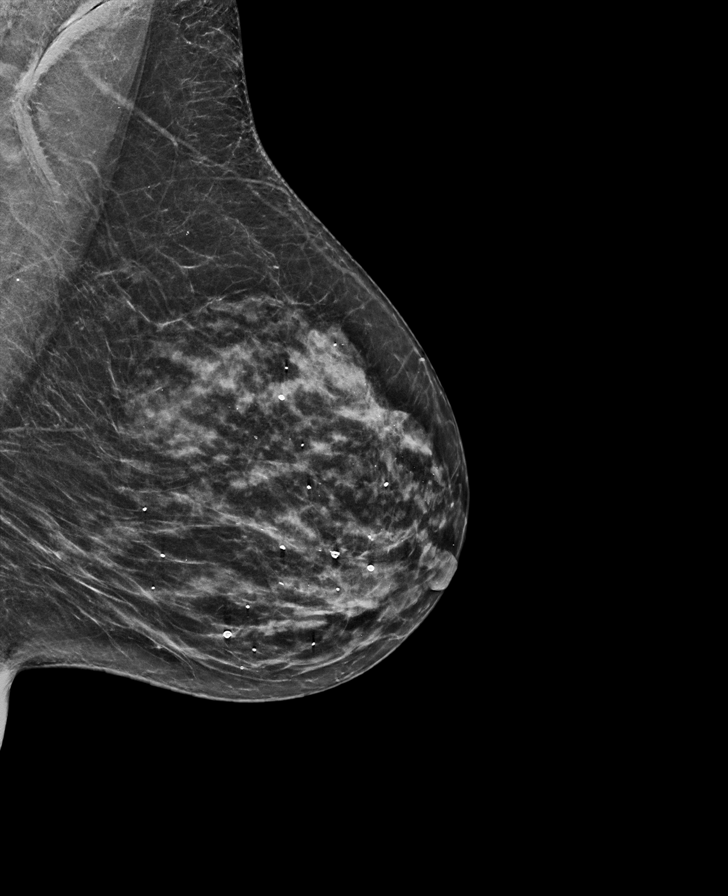

[L CC synth-2D]
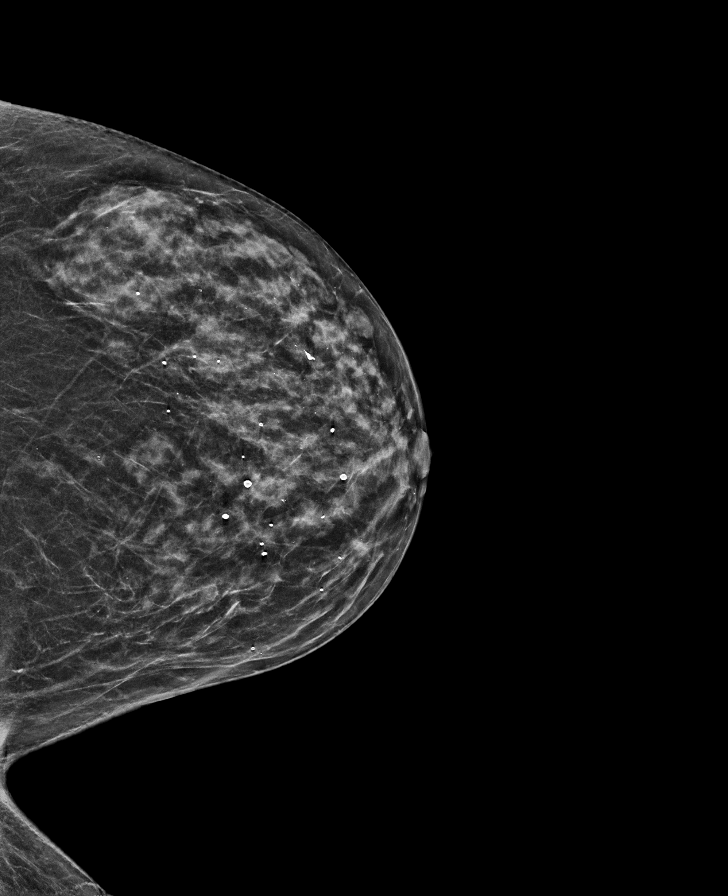

[R MLO synth-2D]
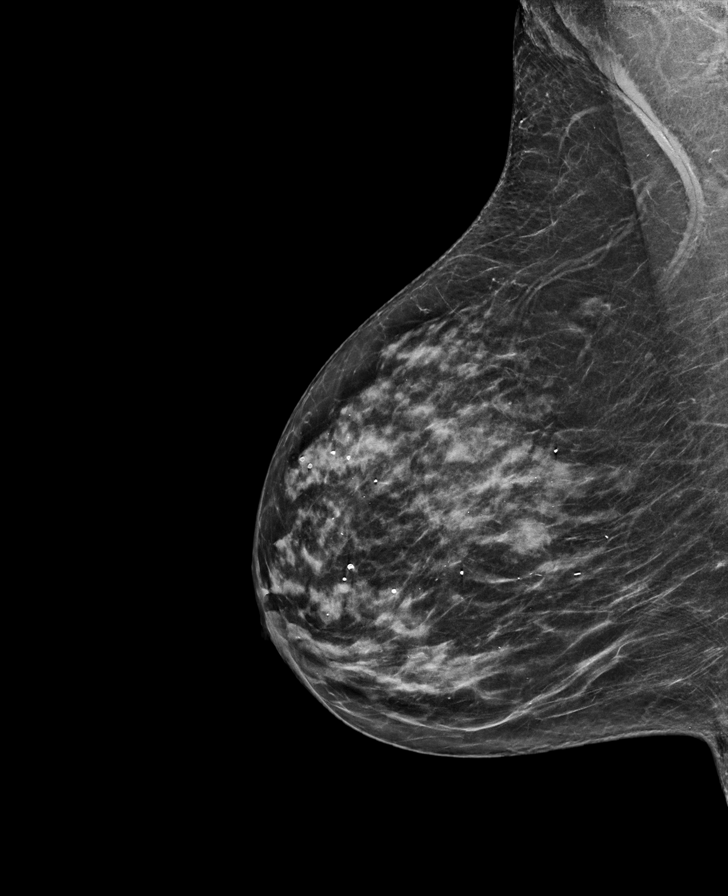

[R MLO tomo · tomo slice 29/57.0]
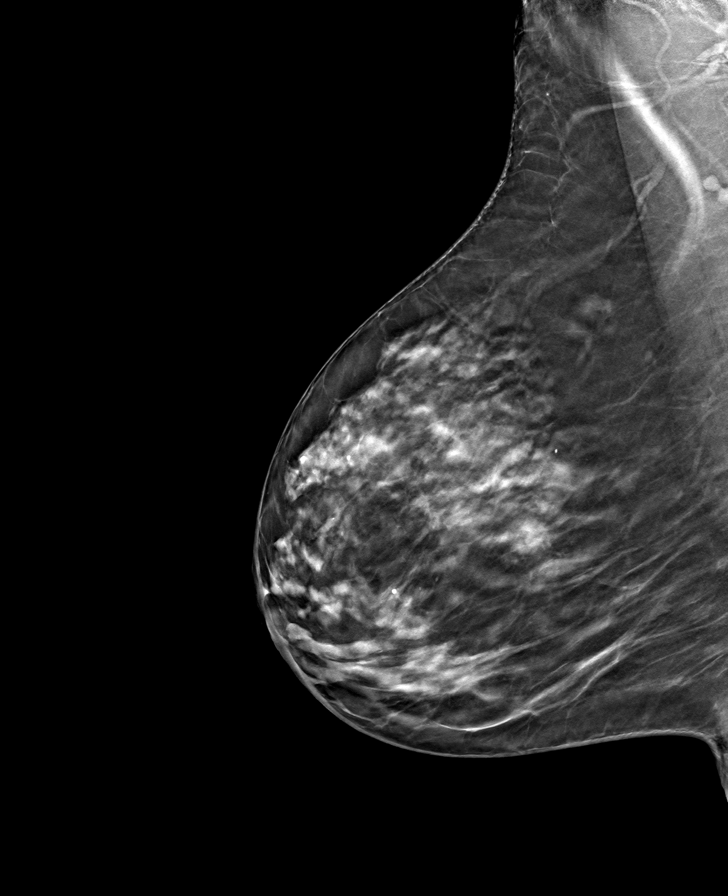

[L MLO tomo · tomo slice 29/56.0]
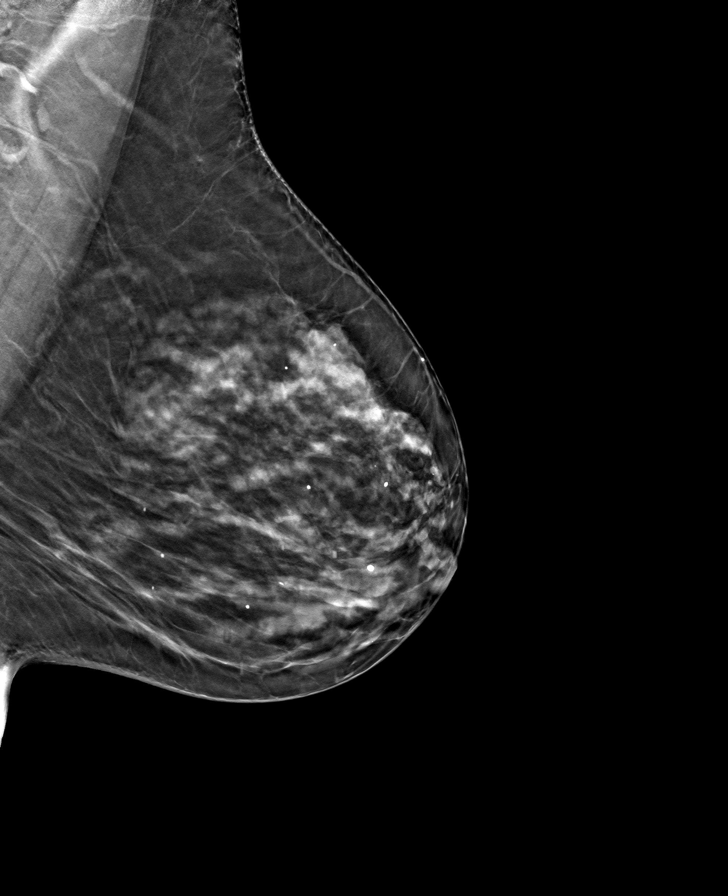

[R CC tomo · tomo slice 29/56.0]
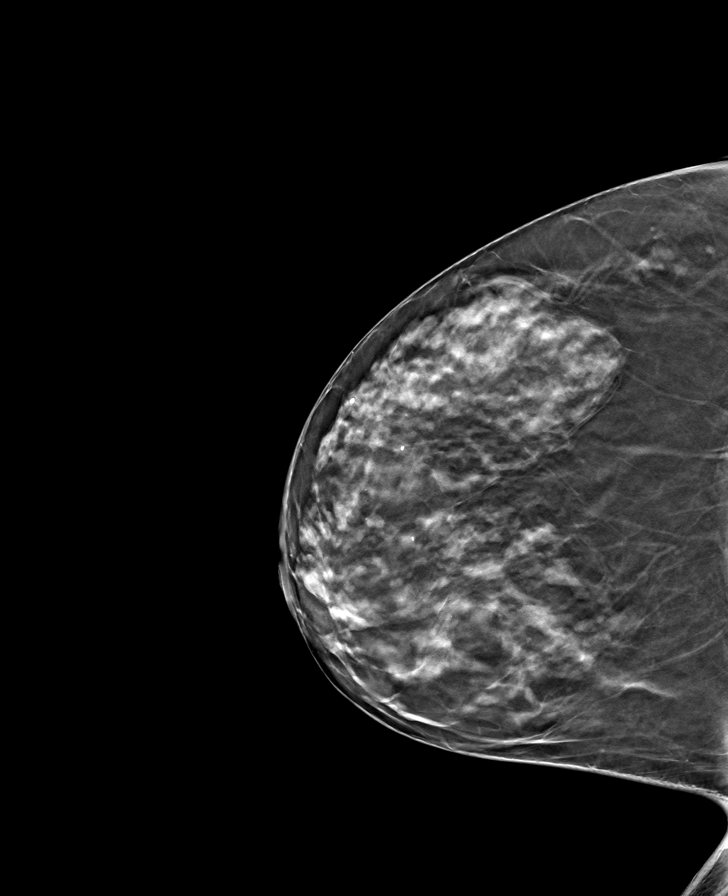

[L CC tomo · tomo slice 26/51.0]
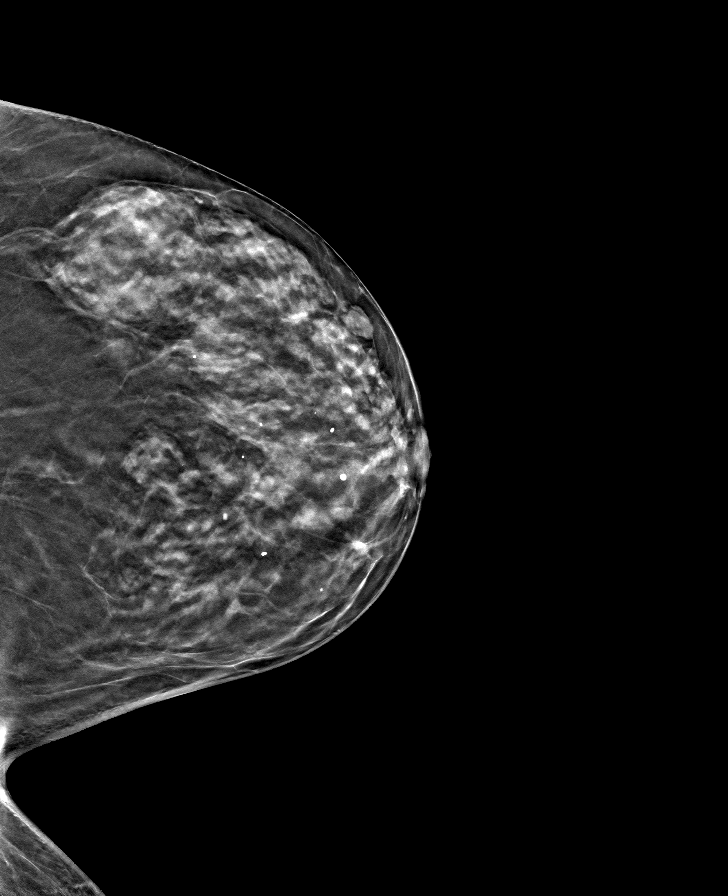

[8 of 24 positions shown; findings below may reference images not displayed]

ACR Breast Density Category c: The breast tissue is heterogeneously
dense, which may obscure small masses.
FINDINGS: There are no findings suspicious for malignancy.
IMPRESSION: No mammographic evidence of malignancy. A result letter of this
screening mammogram will be mailed directly to the patient.

RECOMMENDATION:
Screening mammogram in one year. (Code:Q3-W-BC3)

BI-RADS CATEGORY  1: Negative.

## 2023-04-24 ENCOUNTER — Other Ambulatory Visit: Payer: Self-pay | Admitting: Family Medicine

## 2023-04-24 DIAGNOSIS — Z1231 Encounter for screening mammogram for malignant neoplasm of breast: Secondary | ICD-10-CM

## 2023-05-23 ENCOUNTER — Ambulatory Visit
Admission: RE | Admit: 2023-05-23 | Discharge: 2023-05-23 | Disposition: A | Discharge status: Home / Self Care | Payer: Medicare Other | Source: Ambulatory Visit | Attending: Family Medicine | Admitting: Family Medicine

## 2023-05-23 DIAGNOSIS — Z1231 Encounter for screening mammogram for malignant neoplasm of breast: Secondary | ICD-10-CM

## 2024-05-21 ENCOUNTER — Other Ambulatory Visit: Payer: Self-pay | Admitting: Family Medicine

## 2024-05-21 DIAGNOSIS — Z1231 Encounter for screening mammogram for malignant neoplasm of breast: Secondary | ICD-10-CM

## 2024-06-09 ENCOUNTER — Ambulatory Visit
Admission: RE | Admit: 2024-06-09 | Discharge: 2024-06-09 | Disposition: A | Discharge status: Home / Self Care | Source: Ambulatory Visit | Attending: Family Medicine | Admitting: Family Medicine

## 2024-06-09 DIAGNOSIS — Z1231 Encounter for screening mammogram for malignant neoplasm of breast: Secondary | ICD-10-CM | POA: Diagnosis present
